# Patient Record
Sex: Male | Born: 1970 | Race: White | Hispanic: No | Marital: Single | Smoking: Never smoker
Health system: Southern US, Community
[De-identification: ages and names within clinical notes are randomized; demographics above are authoritative.]

## PROBLEM LIST (undated history)

## (undated) DIAGNOSIS — F419 Anxiety disorder, unspecified: Secondary | ICD-10-CM

## (undated) DIAGNOSIS — I2699 Other pulmonary embolism without acute cor pulmonale: Secondary | ICD-10-CM

## (undated) HISTORY — PX: ANKLE RECONSTRUCTION: SHX1151

## (undated) HISTORY — PX: BACK SURGERY: SHX140

---

## 2000-12-13 ENCOUNTER — Encounter: Payer: Self-pay | Admitting: Family Medicine

## 2000-12-13 ENCOUNTER — Encounter: Admission: RE | Admit: 2000-12-13 | Discharge: 2000-12-13 | Payer: Self-pay | Admitting: Family Medicine

## 2001-01-19 ENCOUNTER — Ambulatory Visit (HOSPITAL_COMMUNITY): Admission: RE | Admit: 2001-01-19 | Discharge: 2001-01-19 | Payer: Self-pay | Admitting: *Deleted

## 2001-01-19 ENCOUNTER — Encounter (INDEPENDENT_AMBULATORY_CARE_PROVIDER_SITE_OTHER): Payer: Self-pay | Admitting: Specialist

## 2001-01-21 ENCOUNTER — Encounter: Payer: Self-pay | Admitting: *Deleted

## 2001-01-21 ENCOUNTER — Ambulatory Visit (HOSPITAL_COMMUNITY): Admission: RE | Admit: 2001-01-21 | Discharge: 2001-01-21 | Payer: Self-pay | Admitting: *Deleted

## 2002-09-18 ENCOUNTER — Encounter: Payer: Self-pay | Admitting: Internal Medicine

## 2002-09-18 ENCOUNTER — Encounter: Admission: RE | Admit: 2002-09-18 | Discharge: 2002-09-18 | Payer: Self-pay | Admitting: Internal Medicine

## 2002-11-20 ENCOUNTER — Ambulatory Visit (HOSPITAL_COMMUNITY): Admission: RE | Admit: 2002-11-20 | Discharge: 2002-11-20 | Payer: Self-pay | Admitting: Gastroenterology

## 2004-01-12 ENCOUNTER — Ambulatory Visit (HOSPITAL_COMMUNITY): Admission: RE | Admit: 2004-01-12 | Discharge: 2004-01-12 | Payer: Self-pay | Admitting: Internal Medicine

## 2004-01-24 ENCOUNTER — Ambulatory Visit (HOSPITAL_COMMUNITY): Admission: RE | Admit: 2004-01-24 | Discharge: 2004-01-25 | Payer: Self-pay | Admitting: Neurosurgery

## 2004-01-26 ENCOUNTER — Inpatient Hospital Stay (HOSPITAL_COMMUNITY): Admission: EM | Admit: 2004-01-26 | Discharge: 2004-02-03 | Payer: Self-pay | Admitting: *Deleted

## 2004-02-04 ENCOUNTER — Encounter: Admission: RE | Admit: 2004-02-04 | Discharge: 2004-02-04 | Payer: Self-pay | Admitting: Family Medicine

## 2005-04-03 ENCOUNTER — Ambulatory Visit (HOSPITAL_COMMUNITY): Admission: RE | Admit: 2005-04-03 | Discharge: 2005-04-03 | Payer: Self-pay | Admitting: Neurosurgery

## 2009-11-15 ENCOUNTER — Emergency Department (HOSPITAL_COMMUNITY): Admission: EM | Admit: 2009-11-15 | Discharge: 2009-11-15 | Payer: Self-pay | Admitting: Emergency Medicine

## 2010-12-13 ENCOUNTER — Encounter: Payer: Self-pay | Admitting: Internal Medicine

## 2010-12-14 ENCOUNTER — Encounter: Payer: Self-pay | Admitting: Neurosurgery

## 2011-04-10 NOTE — Op Note (Signed)
NAME:  Gary Harper, TRAUM                        ACCOUNT NO.:  [id number]   MEDICAL RECORD NO.:  [id number]                   PATIENT TYPE:  OIB   LOCATION:  3014                                 FACILITY:  [facility]   PHYSICIAN:  Donalee Citrin, M.D.                     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   OPERATION PERFORMED:  Thoracic laminectomy with transpedicular decompression  and diskectomy at T8-9 on the right.   SURGEON:  Donalee Citrin, M.D.   ASSISTANT:  Reinaldo Meeker, M.D.   ANESTHESIA:  General endotracheal.   INDICATIONS FOR PROCEDURE:  The patient is a very pleasant 40 year old  gentleman who had two to three weeks ago the immediate onset of severe back  pain with radiating pain and numbness across a band across his chest  consistent with a T9 nerve root distribution.  The patient was refractory to  all forms of anti-inflammatory conservative treatment and steroids orally.  Preoperative imaging showed a very large ruptured disk with a large free  fragment compressing a thoracic spinal cord and displacing it to the left.  Due to the size and location of the fragment, its risks of developing  progressive myelopathy, the patient was recommended laminectomy and  diskectomy as well as transpedicular approach.  I extensively went over the  risks and benefits of surgery with him.  He understands and agrees to  proceed forward.   DESCRIPTION OF PROCEDURE:  The patient was brought to the operating room  where he was induced under general anesthesia.  He was positioned prone on  flat rolls.  His back was prepped and draped in the usual sterile fashion.  Due to the patient's preoperative imaging with both thoracic and lumbar  MRI  which showed a transitional level that was named S1 and five nonrib-bearing  vertebra counting the T8-9 disk space was achieved from below counting the  last rib-bearing vertebra as T12.  A midline incision was made after  localization film achieved and infiltration of 10 mL of lidocaine with  epinephrine, Bovie electrocautery was used to dissect subcutaneous tissue  and subperiosteal dissection was carried out to the lamina of what was  believed to be T8 and 9 but however, intraoperative x-ray confirmed to be  the  T9-10 disk space so this was extended one level up.  Then using a 2 and  3 mm Kerrison punch, inferior aspect of the lamina of T8 was removed.  Medial aspect facet complex and superior aspect of lamina of T9 exposing  ligamentum flavum.  This was removed piecemeal fashion exposing thecal sac.  Thecal sac was immediately noted to be  displaced medially and the disk was  immediately visualized extending dorsal contained within an adhesive  membrane dorsal to the dura.  So in order to remove this without significant  retraction on the thecal sac, the medial aspect facet complex was taken back  to pedicle.  Pedicle was partially bitten down medially and approach was  taken from the medial border of the pedicle down in the disk space and then  a large free fragment was removed.  The interspace was incised with an 11  blade scalpel.  There was already a rent appreciated slightly medial and  then from a lateral approach into the disk space using a micro pituitary the  disk spaces were adequately cleaned out.  There were a couple of other  fragments partially calcified superior aspect of end plate of T9.  These  were also bitten with osteophyte remover and a downgoing Epstein curet and  removed with pituitary rongeurs.  At the end of the diskectomy there was no  further compression on the thecal sac or the T9 nerve root which was  palpated out the foramen.  The thecal sac resumed  its normal and physiologic  location.  Wound was copiously irrigated.  Meticulous hemostasis was  maintained.  Muscle and fascia reapproximated in layers with Vicryl.  Subcutaneous tissue closed with 2-0 interrupted Vicryl.  Skin closed with  running 4-0 subcuticular, Benzoin and Steri-Strips applied.  The patient was  then transferred to the recovery room stable condition.                                               Donalee Citrin, M.D.    GC/MEDQ  D:  01/24/2004  T:  01/24/2004  Job:  [id number]

## 2011-04-10 NOTE — Procedures (Signed)
[facility]  Patient:    Gary Harper, Gary Harper                     MRN: [id number] Proc. Date: 01/19/01 Adm. Date:  [id number] Attending:  Sabino Gasser CC:         Gloriajean Dell. Andrey Campanile, M.D.  Heywood Footman, M.D.   Procedure Report  PROCEDURE:  Upper Endoscopy.  GASTROENTEROLOGIST:  Sabino Gasser, M.D.  INDICATIONS:  Gastroparesis, rule out gastric outlet obstruction.  ANESTHESIA:  Demerol 60 mg, Versed 6 mg.  PROCEDURE IN DETAIL:  The patient had noted that he had not eaten anything solid for nearly 24 hours prior to the procedure.   With the patient mildly sedated and in the left lateral decubitus position, the Olympus video endoscope was inserted in the mouth, passed under direct vision through the esophagus.  The distal esophagus was approached, and there was a question of Barretts esophagus photographed and subsequently biopsied.  We entered into the stomach, and there was a copious amount of bowel-colored clear fluid which we suctioned.  No food particles were noted at this time.  The endoscope was advanced through the stomach.  The antrum and specifically pyloric area were patent.  This was photographed.  We entered into the duodenal bulb which appeared normal, and the second portion duodenum, and there was a question of if the second portion of duodenum was somewhat contracted from below, and this area was photographed.  The mucosa itself appeared normal.  From this point, the endoscope was then slowly withdrawn, taking circumferential views of entire duodenal mucosa until the endoscope had been pulled back into the stomach, placed in retroflexion to view the stomach from below, and this appeared normal.  The endoscope was then straightened and withdrawn, taking circumferential views of entire gastric and esophageal mucosa which otherwise appeared normal.  The patients vital signs and pulse oximetry remained stable.  The patient tolerated the procedure well  with no apparent complications.  FINDINGS: 1. Question of Barretts esophagus, biopsied and photographed. 2. Copious amounts of clear bowel in the stomach without obstruction noted. 3. Question of impingement extrinsically on second portion of duodenum raises    concern about pancreatic process.  PLAN:  CT scan of abdomen; if negative, then gastric emptying study. DD:  01/19/01 TD:  01/19/01 Job: [id number]

## 2011-04-10 NOTE — H&P (Signed)
NAME:  Gary Harper, Gary Harper                        ACCOUNT NO.:  [id number]   MEDICAL RECORD NO.:  [id number]                   PATIENT TYPE:  INP   LOCATION:  3305                                 FACILITY:  [facility]   PHYSICIAN:  Penni Bombard, MD                    DATE OF BIRTH:  07-Sep-1971   DATE OF ADMISSION:  01/26/2004  DATE OF DISCHARGE:                                HISTORY & PHYSICAL   PRIMARY CARE PHYSICIAN:  Dr. Benedetto Goad.   CONSULTING PHYSICIAN:  Dr. Darrin Luis with neurosurgery.   REFERRING PHYSICIAN:  None.   CHIEF COMPLAINT:  Constipation/shortness of breath.   HISTORY OF PRESENT ILLNESS:  Gary Harper is a 40 year old gentleman who,  three days prior to admission, had recently undergone a thoracic  laminectomy.  He was doing well postop with a small hematoma at the incision  site but otherwise was doing fine when, on the day of admission, he strained  for approximately one hour in the bathroom to have a bowel movement and  noticed that he suddenly felt short of breath and dizzy.  He returned to the  bed and lay down.  Throughout the day he attempted several times to get up  and on every attempt felt short of breath and dizzy and was unable to have a  bowel movement throughout the day.  He communicated with Dr. Darrin Luis.  Dr. Darrin Luis  was concerned about his shortness of breath and the patient was greatly  concerned about his constipation.  They decided that he should proceed to  the emergency room.  Upon arrival to the emergency room, the patient again  attempted to have a bowel movement and, while straining, had dramatic  increase in shortness of breath.  The ED physician ordered a D-dimer at that  time which was 6.77 and a spiral CT of the chest which showed bilateral  moderate to large PEs and no DVTs in the lower extremities.  The patient was  thus admitted for anticoagulation.   PAST MEDICAL HISTORY:  Anxiety, sleep disturbance, thoracic laminectomy  three days prior to  admission, irritable bowel syndrome, history of anxiety-  related chest pain, and history of low back laminectomy in approximately  1998.   MEDICATIONS:  1. Paxil 25 mg p.o. daily.  2. Klonopin 1 mg p.o. as needed anxiety.   ALLERGIES:  Sulfa.   SOCIAL HISTORY:  Gary Harper lives in [place] with his wife.  He is in  Air[employer] in [employer].  He has no children.  He denies tobacco, denies  alcohol, and denies drugs.   FAMILY HISTORY:  No diabetes mellitus.  No strokes.  His uncle and  grandfather have both had MIs.  His aunt has breast cancer and he has an  uncle with prostate cancer.   REVIEW OF SYSTEMS:  No fevers, chills, night sweats, and no chest pain since  he has been on Paxil.  Positive abdominal pain that is intermittent.  No  diarrhea.  Positive constipation.  Positive episodes of bright red blood per  rectum which has been fully evaluated by GI and is thought to be secondary  to fissures or hemorrhoids.  No joint pains.  No weakness.   PHYSICAL EXAMINATION:  VITAL SIGNS:  Temp 98.6, pulse 79, respirations 18,  BP 129/70.  O2 98 on room air.  GENERAL:  He is a crying, very anxious, and very upset white male.  CARDIOVASCULAR:  Regular rate and rhythm.  No murmurs, rubs, or gallops.  LUNGS:  Clear to auscultation bilaterally with good air movement.  ABDOMEN:  Positive bowel sounds, nontender, nondistended, and soft.  Palpable stool in the left lower quadrant.  EXTREMITIES:  No cords.  Calves are equal in circumference.  No edema.  BACK:  Wound on back has multiple Steri-Strips in place with blood on the  strips.  There is a small area of a questionable hematoma underneath the  bandages.  MUSCULOSKELETAL:  5/5 bilaterally with 2+ DTRs.  HEENT:  TMs  are clear bilaterally.  Erythematous throat with no exudate.  PERRLA.  No lymphadenopathy.  No thyromegaly.  NEUROLOGIC:  Cranial nerves II-XII are grossly intact.  PSYCH:  Very anxious and upset.  GU:  Nontender prostate.   No nodules.  No impaction.   LABORATORIES:  D-dimer 6.77.  WBC 8.9, hemoglobin 12.9, hematocrit 38.1, and  platelets 292,000 with 66% neutrophils, 22% lymphs, 10% monos.  CT scan  shows bilateral moderate to large PEs.  Lower extremity CT scans are  negative for DVTs.  EKG shows an S in 1 and flipped Ts in III and AVF.  Hemoccult negative.   ASSESSMENT AND PLAN:  This is a 40 year old with bilateral pulmonary emboli  status post recent surgery.  1. Heme.  I spoke extensively with the pharmacy and Dr. Darrin Luis as we are very     concerned about this patient being so close to his surgery that he is at     risk for bleed at the surgery site with resultant cord compression.     Thus, we will start a heparin drip with a half size of the normal bolus     and we will titrate his heparin up slowly as we do not want to overshoot     this patient.  I will check neuro status q.2h. and follow his muscle     strength in his lower extremities.  I will monitor the site for bleeding     and instruct the patient to notify us if he notices any difficulty with     his bowel or bladder or has any tingling or strange sensation in his     lower extremities or notices any lower extremity weakness.  We will start     Coumadin once the patient is safely therapeutic on heparin.  We will     monitor the patient on telemetry.  He is not currently tachycardic or     tachypneic and not currently requiring any O2 SATs.  We will place the     patient on bed rest until he is therapeutic on his heparin.  2. GI.  The patient suffers from severe constipation.  We will give enemas     and start MiraLax and Colace.  We will start Protonix.  3. FEN.  We will start a regular diet and check a BMET in the morning.  4. Anxiety.  We will  continue his Paxil and give the patient Ativan as     needed.  I choose Ativan over Klonopin as I feel this is a better short-    acting agent given the fact that the patient has risk for  respiratory     compromise.  I do not want him on a long-acting agent.  5. Status post thoracic laminectomy by Dr. Darrin Luis.  We will hold on PT for now     given the patient is at high risk for bleed.  6. Social.  We will contact the patient's PCP in the morning as a courtesy     to let him know of his arrival in the hospital.  7. Pain.  We will give Percocet 5/325 one to two tabs p.o. q.4h. as needed     for pain and use morphine for breakthrough.                                                Penni Bombard, MD   SJ/MEDQ  D:  01/28/2004  T:  01/28/2004  Job:  [id number]

## 2011-04-10 NOTE — Discharge Summary (Signed)
NAME:  Gary Harper, Gary Harper                        ACCOUNT NO.:  [id number]   MEDICAL RECORD NO.:  [id number]                   PATIENT TYPE:  INP   LOCATION:  5702                                 FACILITY:  [facility]   PHYSICIAN:  Ike Bene, M.D.              DATE OF BIRTH:  [birth year]   DATE OF ADMISSION:  01/26/2004  DATE OF DISCHARGE:  02/03/2004                                 DISCHARGE SUMMARY   DISCHARGE DIAGNOSES:  1. Pulmonary embolism and infarction.  2. Constipation.   [name] was admitted to the hospital several days after having had  surgery on his back by neurosurgery.  He had a thoracic diskectomy that was  performed.  He was given appropriate DVT prophylaxis during his  hospitalization.  He, however, did have prolonged periods of sedentary state  for several weeks prior to hospitalization, and of course during  hospitalization.  He had no complications with regard to his local surgical  treatment.  There was no evidence of infection or other abnormality.   The patient was initially admitted by the family practice service for  reasons which are unclear; however, when they discovered the patient was my  patient, he was transferred over to our service.  The patient had  significant respiratory distress at the time of admission secondary to  pulmonary embolism.  He gradually improved within 24 hours.  He was given IV  therapy with heparin, and started on Coumadin.   He markedly improved within about 48 hours at the time of admission.  He had  no apparent complications, evidence of bleeding, or any other abnormalities  related to his surgical site.  The majority of his hospitalization was  waiting on his Coumadin treatment to become therapeutic with an adequate  INR.  He was maintained on heparin and Coumadin during that time.  He did  gradually improve each day.  He was able to ambulate without any significant  difficulty.  He does have an area of numbness and  anesthesia on the right  side of his trunk, but it did improve, as well.   DISCHARGE MEDICATIONS:  1. Coumadin 6 mg daily.  2. Paxil CR 25 daily.  3. Klonopin 1 mg as needed.  4. Tylenol two extra strength p.r.n.  (He was avoiding nonsteroidal anti-inflammatory drugs like ibuprofen,  aspirin, and Aleve.)   CONDITION ON DISCHARGE:  He was markedly improved at the time of discharge.   FOLLOW UP:  Follow up with Dr. Merril Abbe on February 06, 2004.                                                Ike Bene, M.D.    RM/MEDQ  D:  03/20/2004  T:  03/21/2004  Job:  [id number]

## 2011-04-10 NOTE — Op Note (Signed)
NAME:  Gary Harper, Gary Harper                        ACCOUNT NO.:  [id number]   MEDICAL RECORD NO.:  [id number]                   PATIENT TYPE:  AMB   LOCATION:  ENDO                                 FACILITY:  [facility]   PHYSICIAN:  Danise Edge, M.D.                DATE OF BIRTH:  [birth year]   DATE OF PROCEDURE:  11/20/2002  DATE OF DISCHARGE:                                 OPERATIVE REPORT   PROCEDURE:  Diagnostic colonoscopy.   INDICATIONS:  The patient is a 40 year old male born 2070/12/31.  The patient  has a two-year history of intermittent sharp with a burning quality  abdominal pain localized to the left lower quadrant.  The onset of pain is  not related to food intake.  When present, the pain gives him the urge to  have a bowel movement.  His bowel movements are not constipated or  diarrheal.  The pain intensity diminishes following bowel movements.  He  denies fever or gastrointestinal bleeding.  His episodes of pain also  respond to localized deep massage.  He reports no weight loss.   His pain will typically occur the first thing in the morning.  Once he has  had bowel movement, he lies in a fetal position and his pain resolves after  approximately 45 minutes.   The patient has undergone an esophagogastroduodenoscopy, upper GI small  bowel follow-through x-ray series, and CT scan of the abdomen and pelvis.  He tells me these studies were normal.   The patient has had prostatitis in the past.  He has had lumbar back surgery  in 2001 but denies back pain.   MEDICATION ALLERGIES:  SULFA.   MEDICATIONS:  Paxil and Klonopin for anxiety, depression, and insomnia.   PAST MEDICAL HISTORY:  Sinus infection, prostatitis, lumbar back surgery,  knee and ankle surgery, anxiety, depression, and insomnia.   FAMILY HISTORY:  His 70 year old father has hyperlipidemia.  His 20 year old  mother has rheumatoid arthritis.   HABITS:  The patient is a Immunologist at [employer].  He  rarely consumes alcohol.  He denies the use of tobacco products.  He is not  married and has no children.   ENDOSCOPIST:  Danise Edge, M.D.   PREMEDICATION:  Versed 10 mg, Demerol 100 mg.   ENDOSCOPE:  Olympus pediatric colonoscope.   DESCRIPTION OF PROCEDURE:  After obtaining informed consent, the patient was  placed in the left lateral decubitus position.  I administered intravenous  Demerol and intravenous Versed to achieve conscious sedation for the  procedure.  The patient's blood pressure, oxygen saturation, and cardiac  rhythm were monitored throughout the procedure and documented in the medical  record.   Anal inspection was normal.  Digital rectal exam revealed an enlarged but  non-nodular prostate.  The Olympus pediatric video colonoscope was  introduced into the rectum and easily advanced to the cecum.  A normally-  appearing  ileocecal valve was intubated and the distal ileum inspected.  Colonic preparation for the exam today was excellent.   Rectum normal.   Sigmoid colon and descending colon normal.   Splenic flexure normal.   Transverse colon normal.   Hepatic flexure normal.   Ascending colon normal.   Cecum and ileocecal valve normal.   Distal ileum normal.   ASSESSMENT:  Normal proctocolonoscopy to the cecum and distal ileoscopy.   RECOMMENDATIONS:  I suspect the patient has functional abdominal (large  bowel) pain and his predominant symptom of irritable bowel syndrome is  abdominal pain.  Will place him on an antispasmodic.                                                Danise Edge, M.D.    MJ/MEDQ  D:  11/20/2002  T:  11/20/2002  Job:  [id number]

## 2015-05-28 ENCOUNTER — Ambulatory Visit
Admission: RE | Admit: 2015-05-28 | Discharge: 2015-05-28 | Disposition: A | Discharge status: Home / Self Care | Payer: Self-pay | Source: Ambulatory Visit | Attending: Internal Medicine | Admitting: Internal Medicine

## 2015-05-28 ENCOUNTER — Other Ambulatory Visit: Payer: Self-pay | Admitting: Internal Medicine

## 2015-05-28 DIAGNOSIS — R059 Cough, unspecified: Secondary | ICD-10-CM

## 2015-05-28 DIAGNOSIS — R05 Cough: Secondary | ICD-10-CM

## 2015-07-17 ENCOUNTER — Other Ambulatory Visit: Payer: Self-pay | Admitting: Internal Medicine

## 2015-07-17 DIAGNOSIS — R1032 Left lower quadrant pain: Secondary | ICD-10-CM

## 2015-07-22 ENCOUNTER — Ambulatory Visit
Admission: RE | Admit: 2015-07-22 | Discharge: 2015-07-22 | Disposition: A | Discharge status: Home / Self Care | Payer: BLUE CROSS/BLUE SHIELD | Source: Ambulatory Visit | Attending: Internal Medicine | Admitting: Internal Medicine

## 2015-07-22 DIAGNOSIS — R1032 Left lower quadrant pain: Secondary | ICD-10-CM

## 2015-07-22 MED ORDER — IOPAMIDOL (ISOVUE-300) INJECTION 61%
125.0000 mL | Freq: Once | INTRAVENOUS | Status: AC | PRN
  Administered 2015-07-22: 125 mL via INTRAVENOUS

## 2016-04-01 ENCOUNTER — Other Ambulatory Visit: Payer: Self-pay | Admitting: Internal Medicine

## 2016-04-01 DIAGNOSIS — M79605 Pain in left leg: Secondary | ICD-10-CM

## 2016-04-01 DIAGNOSIS — R109 Unspecified abdominal pain: Secondary | ICD-10-CM

## 2016-04-05 ENCOUNTER — Ambulatory Visit
Admission: RE | Admit: 2016-04-05 | Discharge: 2016-04-05 | Disposition: A | Discharge status: Home / Self Care | Payer: BLUE CROSS/BLUE SHIELD | Source: Ambulatory Visit | Attending: Internal Medicine | Admitting: Internal Medicine

## 2016-04-05 DIAGNOSIS — M79605 Pain in left leg: Secondary | ICD-10-CM

## 2016-04-05 DIAGNOSIS — R109 Unspecified abdominal pain: Secondary | ICD-10-CM

## 2016-04-05 MED ORDER — GADOBENATE DIMEGLUMINE 529 MG/ML IV SOLN
20.0000 mL | Freq: Once | INTRAVENOUS | Status: AC | PRN
  Administered 2016-04-05: 20 mL via INTRAVENOUS

## 2017-07-05 ENCOUNTER — Emergency Department (HOSPITAL_COMMUNITY): Payer: BLUE CROSS/BLUE SHIELD

## 2017-07-05 ENCOUNTER — Encounter (HOSPITAL_COMMUNITY): Payer: Self-pay | Admitting: Emergency Medicine

## 2017-07-05 ENCOUNTER — Emergency Department (HOSPITAL_COMMUNITY)
Admission: EM | Admit: 2017-07-05 | Discharge: 2017-07-05 | Disposition: A | Discharge status: Home / Self Care | Payer: BLUE CROSS/BLUE SHIELD | Attending: Emergency Medicine | Admitting: Emergency Medicine

## 2017-07-05 DIAGNOSIS — R0789 Other chest pain: Secondary | ICD-10-CM | POA: Diagnosis not present

## 2017-07-05 DIAGNOSIS — R05 Cough: Secondary | ICD-10-CM | POA: Insufficient documentation

## 2017-07-05 DIAGNOSIS — R0602 Shortness of breath: Secondary | ICD-10-CM | POA: Insufficient documentation

## 2017-07-05 HISTORY — DX: Anxiety disorder, unspecified: F41.9

## 2017-07-05 HISTORY — DX: Other pulmonary embolism without acute cor pulmonale: I26.99

## 2017-07-05 LAB — CBC WITH DIFFERENTIAL/PLATELET
BASOS ABS: 0 10*3/uL (ref 0.0–0.1)
Basophils Relative: 0 %
EOS ABS: 0 10*3/uL (ref 0.0–0.7)
EOS PCT: 0 %
HCT: 39.6 % (ref 39.0–52.0)
Hemoglobin: 13.5 g/dL (ref 13.0–17.0)
Lymphocytes Relative: 23 %
Lymphs Abs: 1.9 10*3/uL (ref 0.7–4.0)
MCH: 30.9 pg (ref 26.0–34.0)
MCHC: 34.1 g/dL (ref 30.0–36.0)
MCV: 90.6 fL (ref 78.0–100.0)
MONO ABS: 0.7 10*3/uL (ref 0.1–1.0)
Monocytes Relative: 9 %
Neutro Abs: 5.4 10*3/uL (ref 1.7–7.7)
Neutrophils Relative %: 68 %
PLATELETS: 259 10*3/uL (ref 150–400)
RBC: 4.37 MIL/uL (ref 4.22–5.81)
RDW: 12.9 % (ref 11.5–15.5)
WBC: 8 10*3/uL (ref 4.0–10.5)

## 2017-07-05 LAB — BASIC METABOLIC PANEL
ANION GAP: 11 (ref 5–15)
BUN: 13 mg/dL (ref 6–20)
CALCIUM: 9.2 mg/dL (ref 8.9–10.3)
CO2: 22 mmol/L (ref 22–32)
Chloride: 104 mmol/L (ref 101–111)
Creatinine, Ser: 1.05 mg/dL (ref 0.61–1.24)
GFR calc Af Amer: >60 mL/min (ref >60)
GLUCOSE: 110 mg/dL — AB (ref 65–99)
Potassium: 3.7 mmol/L (ref 3.5–5.1)
SODIUM: 137 mmol/L (ref 135–145)

## 2017-07-05 LAB — I-STAT TROPONIN, ED: Troponin i, poc: 0 ng/mL (ref 0.00–0.08)

## 2017-07-05 LAB — TROPONIN I

## 2017-07-05 MED ORDER — IOPAMIDOL (ISOVUE-370) INJECTION 76%
INTRAVENOUS | Status: AC
  Filled 2017-07-05: qty 100

## 2017-07-05 MED ORDER — IOPAMIDOL (ISOVUE-370) INJECTION 76%
100.0000 mL | Freq: Once | INTRAVENOUS | Status: AC | PRN
  Administered 2017-07-05: 100 mL via INTRAVENOUS

## 2017-07-05 MED ORDER — SODIUM CHLORIDE 0.9 % IV SOLN
INTRAVENOUS | Status: DC
  Administered 2017-07-05: 20:00:00 via INTRAVENOUS

## 2017-07-05 MED ORDER — SODIUM CHLORIDE 0.9 % IJ SOLN
INTRAMUSCULAR | Status: AC
  Filled 2017-07-05: qty 50

## 2017-07-05 MED ORDER — ACETAMINOPHEN 500 MG PO TABS
1000.0000 mg | ORAL_TABLET | Freq: Once | ORAL | Status: AC
  Administered 2017-07-05: 1000 mg via ORAL
  Filled 2017-07-05: qty 2

## 2017-07-05 MED ORDER — KETOROLAC TROMETHAMINE 30 MG/ML IJ SOLN
15.0000 mg | Freq: Once | INTRAMUSCULAR | Status: AC
  Administered 2017-07-05: 15 mg via INTRAVENOUS
  Filled 2017-07-05: qty 1

## 2017-07-05 MED ORDER — METHOCARBAMOL 750 MG PO TABS: 750.0000 mg | ORAL_TABLET | Freq: Four times a day (QID) | ORAL | 0 refills | Status: DC

## 2017-07-05 MED ORDER — LORAZEPAM 2 MG/ML IJ SOLN
0.5000 mg | Freq: Once | INTRAMUSCULAR | Status: AC
  Administered 2017-07-05: 0.5 mg via INTRAVENOUS
  Filled 2017-07-05: qty 1

## 2017-07-05 NOTE — ED Triage Notes (Signed)
Pt was sent in by PCP after having a cough with chest pain. States that he has a hx of PE and was sent for CT angio to r/o such. Alert and oriented. 99% RA.

## 2017-07-05 NOTE — ED Notes (Signed)
Patient transported to CT 

## 2017-07-05 NOTE — ED Provider Notes (Signed)
[facility]-EMERGENCY DEPT Provider Note   CSN: [id number] Arrival date & time: 07/05/17  1712     History   Chief Complaint Chief Complaint  Patient presents with  . Shortness of Breath    HPI Gary Harper is a 46 y.o. male.  46 year old malepresents with 2 day history of chest discomfort characterized as left-sided pleuritic pain. History of PE in the past associated after surgery. Does not currently take any blood thinners. Denies any leg pain or swelling. Does take medications chronically for anxiety. Has had a nonproductive cough without fever or chills. Denies any anginal symptoms. Saw his doctor today and was sent to for further evaluation.      Past Medical History:  Diagnosis Date  . Anxiety   . PE (pulmonary thromboembolism) (HCC)     There are no active problems to display for this patient.   Past Surgical History:  Procedure Laterality Date  . ANKLE RECONSTRUCTION    . BACK SURGERY         Home Medications    Prior to Admission medications   Not on File    Family History No family history on file.  Social History Social History  Substance Use Topics  . Smoking status: Never Smoker  . Smokeless tobacco: Not on file  . Alcohol use Yes     Allergies   Sulfa antibiotics   Review of Systems Review of Systems  All other systems reviewed and are negative.    Physical Exam Updated Vital Signs BP (!) 151/98 (BP Location: Left Arm)   Pulse 70   Temp 98.8 F (37.1 C) (Oral)   Resp 18   SpO2 99%   Physical Exam  Constitutional: He is oriented to person, place, and time. He appears well-developed and well-nourished.  Non-toxic appearance. No distress.  HENT:  Head: Normocephalic and atraumatic.  Eyes: Pupils are equal, round, and reactive to light. Conjunctivae, EOM and lids are normal.  Neck: Normal range of motion. Neck supple. No tracheal deviation present. No thyroid mass present.  Cardiovascular: Normal rate, regular rhythm and  normal heart sounds.  Exam reveals no gallop.   No murmur heard. Pulmonary/Chest: Effort normal and breath sounds normal. No stridor. No respiratory distress. He has no decreased breath sounds. He has no wheezes. He has no rhonchi. He has no rales.  Abdominal: Soft. Normal appearance and bowel sounds are normal. He exhibits no distension. There is no tenderness. There is no rebound and no CVA tenderness.  Musculoskeletal: Normal range of motion. He exhibits no edema or tenderness.  Neurological: He is alert and oriented to person, place, and time. He has normal strength. No cranial nerve deficit or sensory deficit. GCS eye subscore is 4. GCS verbal subscore is 5. GCS motor subscore is 6.  Skin: Skin is warm and dry. No abrasion and no rash noted.  Psychiatric: He has a normal mood and affect. His speech is normal and behavior is normal.  Nursing note and vitals reviewed.    ED Treatments / Results  Labs (all labs ordered are listed, but only abnormal results are displayed) Labs Reviewed  CBC WITH DIFFERENTIAL/PLATELET  BASIC METABOLIC PANEL  TROPONIN I    EKG  EKG Interpretation  Date/Time:  Monday July 05 2017 17:44:41 EDT Ventricular Rate:  78 PR Interval:    QRS Duration: 91 QT Interval:  378 QTC Calculation: 431 R Axis:   73 Text Interpretation:  Sinus rhythm Probable left atrial enlargement RSR' in V1 or V2,  probably normal variant ST elev, probable normal early repol pattern Baseline wander in lead(s) V2 No significant change since last tracing Confirmed by Lorre Nick ([id number]) on 07/05/2017 5:54:34 PM       Radiology No results found.  Procedures Procedures (including critical care time)  Medications Ordered in ED Medications  0.9 %  sodium chloride infusion (not administered)     Initial Impression / Assessment and Plan / ED Course  I have reviewed the triage vital signs and the nursing notes.  Pertinent labs & imaging results that were available during  my care of the patient were reviewed by me and considered in my medical decision making (see chart for details).     8:51 PM Patient reassessed and chest CT negative for PE. EKG without acute ischemic changes. Troponin negative.  9:43 PM Patient reassessed after being given pain medicine feels better. Do not think this represents ACS. Patient's heart score is 1. Return precautions given  Final Clinical Impressions(s) / ED Diagnoses   Final diagnoses:  None    New Prescriptions New Prescriptions   No medications on file     Lorre Nick, MD 07/05/17 2143

## 2017-07-05 NOTE — ED Notes (Signed)
Pt sent by PCP Valentina Lucks([place]) to r/o PE.  C/o chest pain and SOB.  Hx of PE.

## 2017-12-18 IMAGING — CT CT ANGIO CHEST
2 of 6 series · 18 of 36 positions shown · IV contrast (ISOVUE 370)
Comparison: Chest radiograph 05/28/2015

CLINICAL DATA: Cough with chest pain.  History of the.

EXAM:
CT ANGIOGRAPHY CHEST WITH CONTRAST
TECHNIQUE: Multidetector CT imaging of the chest was performed using the
standard protocol during bolus administration of intravenous
contrast. Multiplanar CT image reconstructions and MIPs were
obtained to evaluate the vascular anatomy.
CONTRAST:  100 mL Isovue 370

[Series 7: thins for pacs · axial · 0.82mm/px · z∈[-320,-18]mm · 17 of 336 slices shown]
[im 17/336  lung]
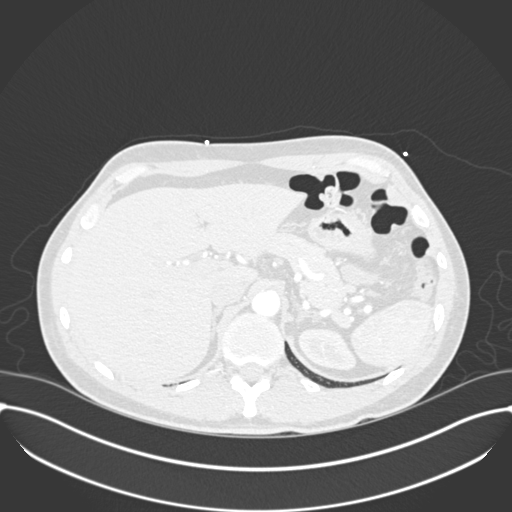
[im 34/336  mediastinal]
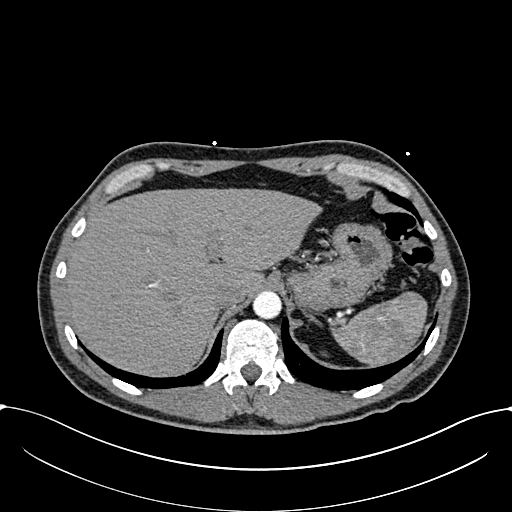
[im 51/336  lung]
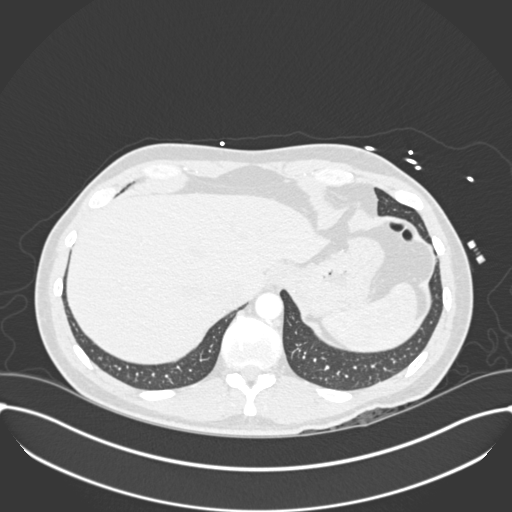
[im 68/336  mediastinal]
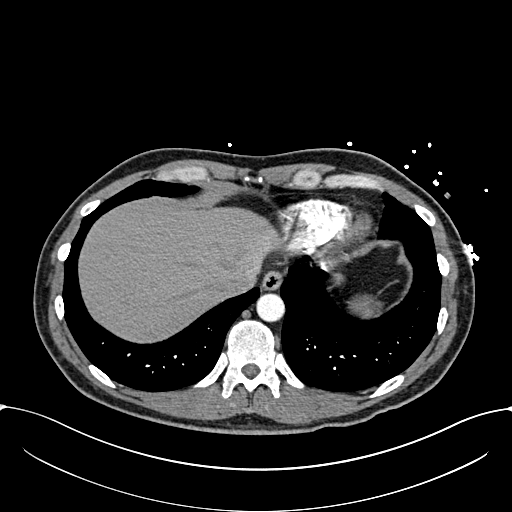
[im 101/336  lung]
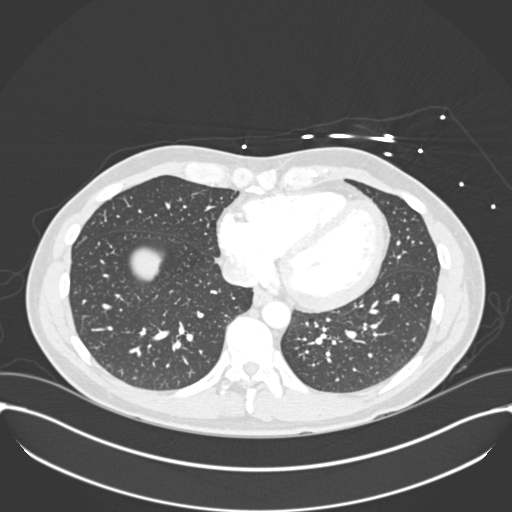
[im 118/336  mediastinal]
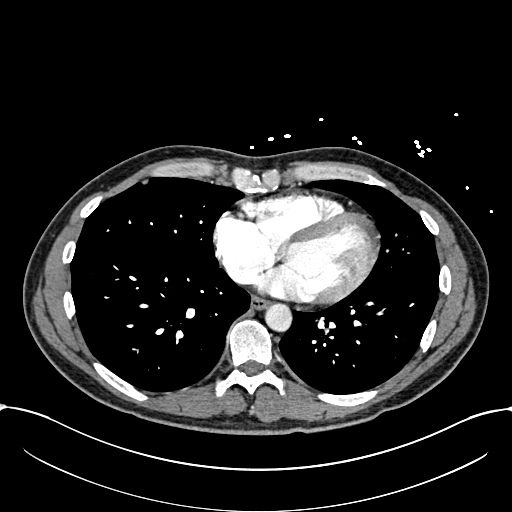
[im 135/336  lung]
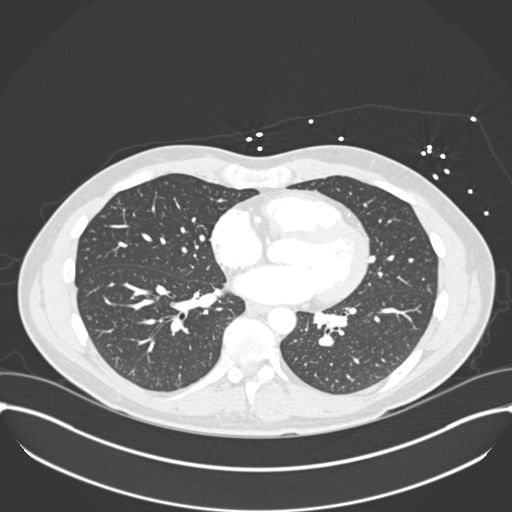
[im 151/336  mediastinal]
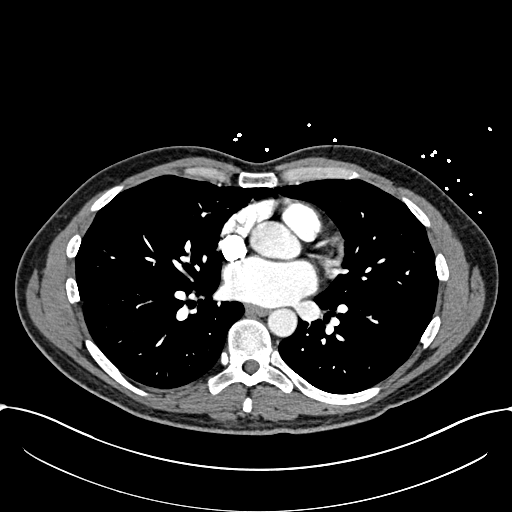
[im 168/336  lung]
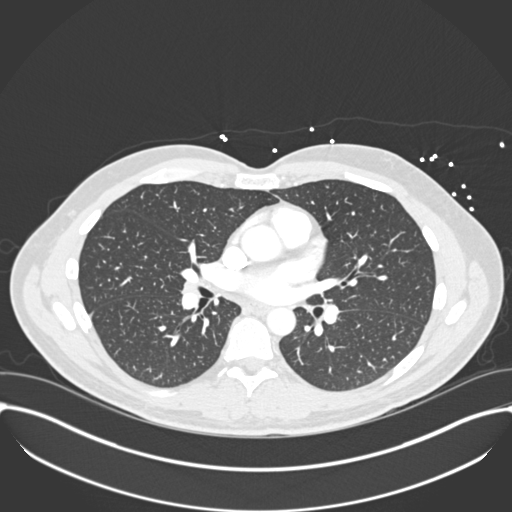
[im 185/336  mediastinal]
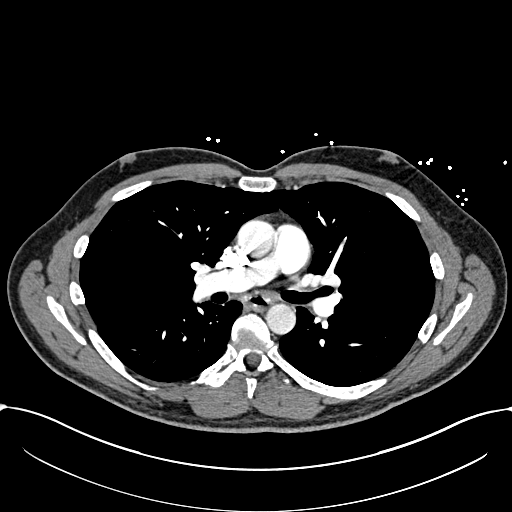
[im 202/336  lung]
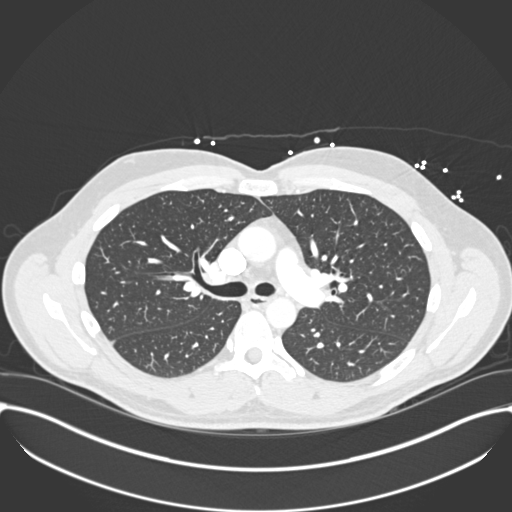
[im 218/336  mediastinal]
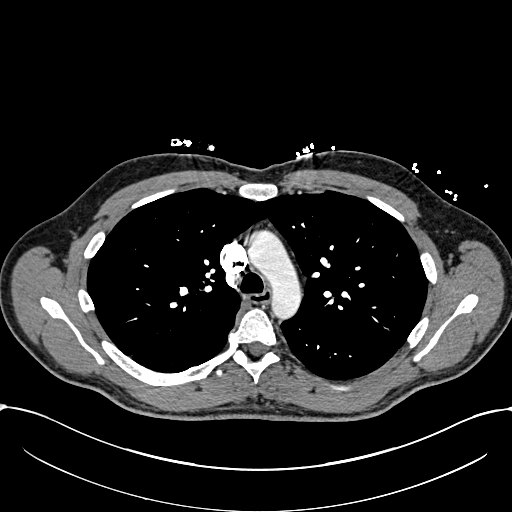
[im 235/336  lung]
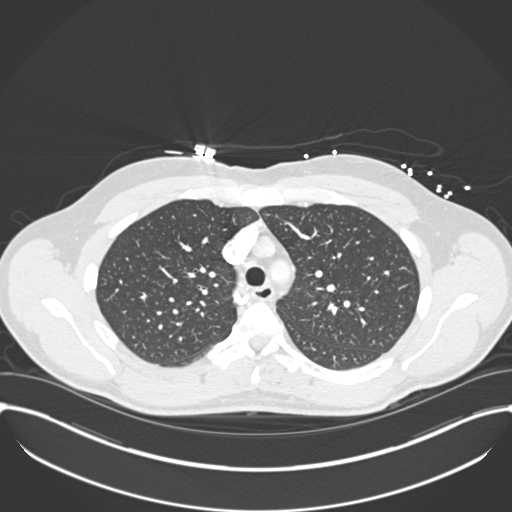
[im 269/336  mediastinal]
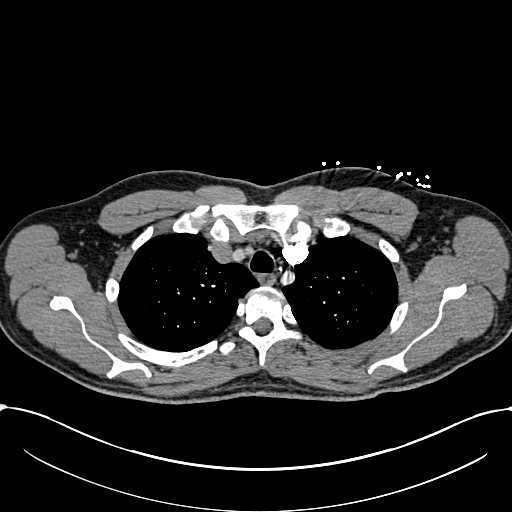
[im 285/336  lung]
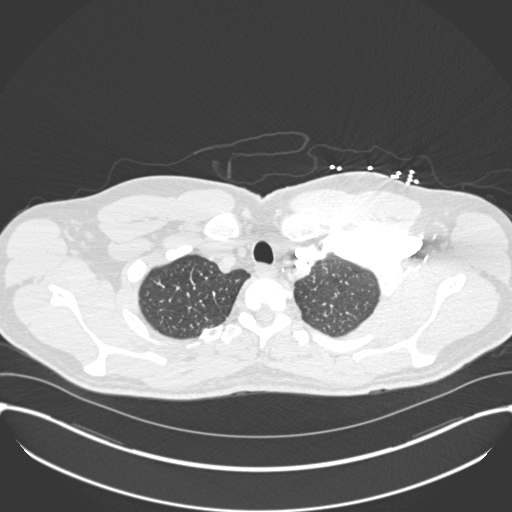
[im 302/336  mediastinal]
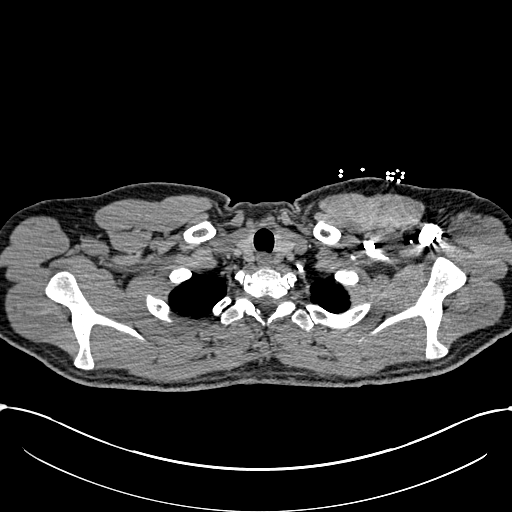
[im 319/336  lung]
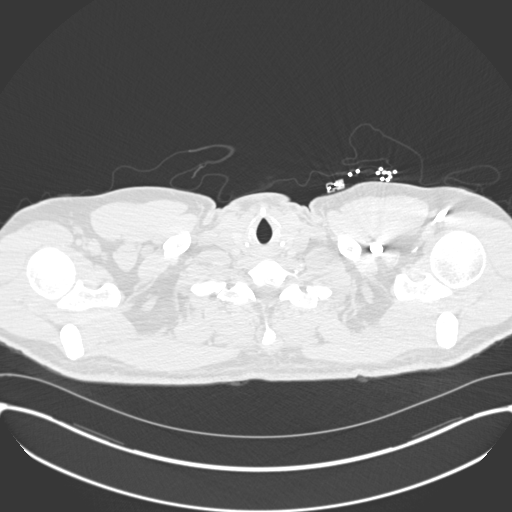

[Series 9: coronal mpr · coronal · 0.65mm/px · 1 of 147 slices shown]
[im 74/147  mediastinal]
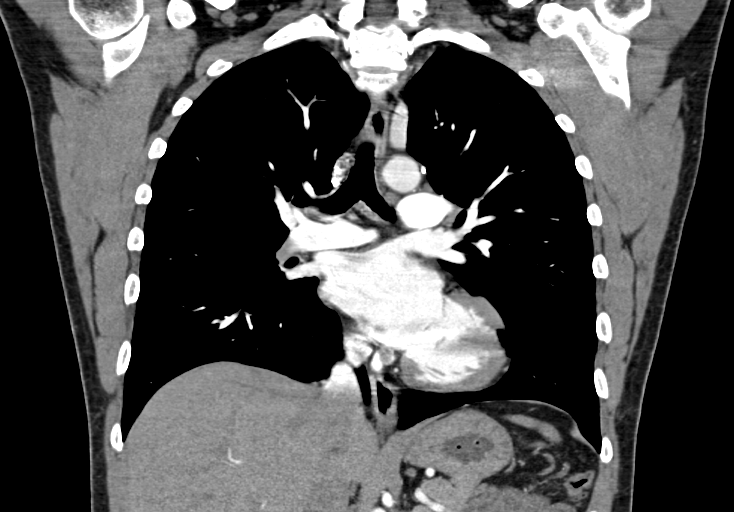

[18 of 36 positions shown; findings below may reference images not displayed]

FINDINGS: Cardiovascular: Contrast injection is sufficient to demonstrate
satisfactory opacification of the pulmonary arteries to the
segmental level. There is no pulmonary embolus. The main pulmonary
artery is within normal limits for size. There is no CT evidence of
acute right heart strain. The visualized aorta is normal. There is a
normal variant aortic arch branching pattern with the
brachiocephalic and left common carotid arteries sharing a common
origin.. Heart size is normal, without pericardial effusion.

Mediastinum/Nodes: No mediastinal, hilar or axillary
lymphadenopathy. The visualized thyroid and thoracic esophageal
course are unremarkable.

Lungs/Pleura: No pulmonary nodules or masses. No pleural effusion or
pneumothorax. No focal airspace consolidation. No focal pleural
abnormality.

Upper Abdomen: Contrast bolus timing is not optimized for evaluation
of the abdominal organs. Within this limitation, the visualized
organs of the upper abdomen are normal.

Musculoskeletal: No chest wall abnormality. No acute or significant
osseous findings.

Review of the MIP images confirms the above findings.
IMPRESSION: No pulmonary embolus or other acute thoracic abnormality.

## 2018-05-01 ENCOUNTER — Other Ambulatory Visit: Payer: Self-pay

## 2018-05-01 ENCOUNTER — Encounter (HOSPITAL_COMMUNITY): Payer: Self-pay

## 2018-05-01 ENCOUNTER — Emergency Department (HOSPITAL_COMMUNITY)
Admission: EM | Admit: 2018-05-01 | Discharge: 2018-05-02 | Disposition: A | Discharge status: Other Acute Inpatient Hospital | Payer: BLUE CROSS/BLUE SHIELD | Attending: Emergency Medicine | Admitting: Emergency Medicine

## 2018-05-01 DIAGNOSIS — R45851 Suicidal ideations: Secondary | ICD-10-CM | POA: Insufficient documentation

## 2018-05-01 DIAGNOSIS — Z79899 Other long term (current) drug therapy: Secondary | ICD-10-CM | POA: Insufficient documentation

## 2018-05-01 DIAGNOSIS — R531 Weakness: Secondary | ICD-10-CM | POA: Insufficient documentation

## 2018-05-01 DIAGNOSIS — T50902A Poisoning by unspecified drugs, medicaments and biological substances, intentional self-harm, initial encounter: Secondary | ICD-10-CM | POA: Diagnosis present

## 2018-05-01 DIAGNOSIS — F419 Anxiety disorder, unspecified: Secondary | ICD-10-CM | POA: Insufficient documentation

## 2018-05-01 DIAGNOSIS — Z86711 Personal history of pulmonary embolism: Secondary | ICD-10-CM | POA: Insufficient documentation

## 2018-05-01 DIAGNOSIS — F329 Major depressive disorder, single episode, unspecified: Secondary | ICD-10-CM | POA: Diagnosis not present

## 2018-05-01 DIAGNOSIS — R450 Nervousness: Secondary | ICD-10-CM | POA: Diagnosis not present

## 2018-05-01 DIAGNOSIS — R4587 Impulsiveness: Secondary | ICD-10-CM | POA: Diagnosis not present

## 2018-05-01 DIAGNOSIS — Z008 Encounter for other general examination: Secondary | ICD-10-CM | POA: Diagnosis present

## 2018-05-01 LAB — CBC WITH DIFFERENTIAL/PLATELET
BASOS PCT: 0 %
Basophils Absolute: 0 10*3/uL (ref 0.0–0.1)
EOS ABS: 0 10*3/uL (ref 0.0–0.7)
EOS PCT: 0 %
HEMATOCRIT: 40.4 % (ref 39.0–52.0)
Hemoglobin: 13.7 g/dL (ref 13.0–17.0)
Lymphocytes Relative: 16 %
Lymphs Abs: 1 10*3/uL (ref 0.7–4.0)
MCH: 31.1 pg (ref 26.0–34.0)
MCHC: 33.9 g/dL (ref 30.0–36.0)
MCV: 91.8 fL (ref 78.0–100.0)
MONO ABS: 0.4 10*3/uL (ref 0.1–1.0)
Monocytes Relative: 6 %
Neutro Abs: 4.5 10*3/uL (ref 1.7–7.7)
Neutrophils Relative %: 78 %
PLATELETS: 232 10*3/uL (ref 150–400)
RBC: 4.4 MIL/uL (ref 4.22–5.81)
RDW: 13 % (ref 11.5–15.5)
WBC: 5.9 10*3/uL (ref 4.0–10.5)

## 2018-05-01 LAB — ETHANOL

## 2018-05-01 LAB — COMPREHENSIVE METABOLIC PANEL
ALK PHOS: 42 U/L (ref 38–126)
ALT: 25 U/L (ref 17–63)
ANION GAP: 10 (ref 5–15)
AST: 30 U/L (ref 15–41)
Albumin: 4.3 g/dL (ref 3.5–5.0)
BUN: 13 mg/dL (ref 6–20)
CALCIUM: 8.9 mg/dL (ref 8.9–10.3)
CO2: 24 mmol/L (ref 22–32)
Chloride: 106 mmol/L (ref 101–111)
Creatinine, Ser: 1 mg/dL (ref 0.61–1.24)
Glucose, Bld: 113 mg/dL — ABNORMAL HIGH (ref 65–99)
Potassium: 4.2 mmol/L (ref 3.5–5.1)
Sodium: 140 mmol/L (ref 135–145)
TOTAL PROTEIN: 7.2 g/dL (ref 6.5–8.1)
Total Bilirubin: 0.4 mg/dL (ref 0.3–1.2)

## 2018-05-01 LAB — ACETAMINOPHEN LEVEL

## 2018-05-01 LAB — SALICYLATE LEVEL: Salicylate Lvl: <7 mg/dL (ref 2.8–30.0)

## 2018-05-01 LAB — CBG MONITORING, ED: GLUCOSE-CAPILLARY: 113 mg/dL — AB (ref 65–99)

## 2018-05-01 MED ORDER — LORAZEPAM 1 MG PO TABS: 0.0000 mg | ORAL_TABLET | Freq: Two times a day (BID) | ORAL | Status: DC

## 2018-05-01 MED ORDER — LORAZEPAM 1 MG PO TABS
0.0000 mg | ORAL_TABLET | Freq: Four times a day (QID) | ORAL | Status: DC
  Administered 2018-05-01: 1 mg via ORAL
  Filled 2018-05-01: qty 1

## 2018-05-01 MED ORDER — THIAMINE HCL 100 MG/ML IJ SOLN: 100.0000 mg | Freq: Every day | INTRAMUSCULAR | Status: DC

## 2018-05-01 MED ORDER — LORAZEPAM 2 MG/ML IJ SOLN: 0.0000 mg | Freq: Two times a day (BID) | INTRAMUSCULAR | Status: DC

## 2018-05-01 MED ORDER — LORAZEPAM 2 MG/ML IJ SOLN: 0.0000 mg | Freq: Four times a day (QID) | INTRAMUSCULAR | Status: DC

## 2018-05-01 MED ORDER — VITAMIN B-1 100 MG PO TABS
100.0000 mg | ORAL_TABLET | Freq: Every day | ORAL | Status: DC
  Administered 2018-05-01 – 2018-05-02 (×2): 100 mg via ORAL
  Filled 2018-05-01 (×2): qty 1

## 2018-05-01 NOTE — ED Notes (Signed)
Sitter at bedside.

## 2018-05-01 NOTE — ED Notes (Signed)
Pt has been wanded by security and personal belongings have been bagged and put in cabinets by 23-25.

## 2018-05-01 NOTE — ED Notes (Signed)
Gave pt urinal.  Pt stated that he could not urinate.

## 2018-05-01 NOTE — ED Notes (Signed)
Pt refused vitals 

## 2018-05-01 NOTE — ED Triage Notes (Signed)
Pt comes home. Pt is arrived via [facility]. Pt had taken around 20 trazodone 100mg  tablets with a few beers last night. Wife found him hard to wake up and pt was complaining of dizziness. Pt is AOx4 and Ambulatory. Pt denies SI and HI as of now.

## 2018-05-01 NOTE — ED Provider Notes (Signed)
[facility]-EMERGENCY DEPT Provider Note   CSN: [id number] Arrival date & time: 05/01/18  1344     History   Chief Complaint Chief Complaint  Patient presents with  . Suicidal  . Drug Overdose    HPI Gary Harper is a 47 y.o. male.  47 year old male presents after reported overdose.  Patient reports that he is going through a separation with his wife and has been depressed.  He reports that last night he felt more depressed and attempted overdose.  He reports that after drinking moderate amount of alcohol he took a total of 20 tablets of trazodone.  Each tablet was 100 mg.  This morning he was found by his wife at home and sent to the ED for evaluation.  Upon arrival he is alert and oriented.  He reports that he feels "weak" following the OD last night.  He denies any chest pain, nausea, vomiting, fever, or other acute complaint now.  He endorses mild suicidal ideation at this time. He denies prior suicide attempts prior to last night.   The history is provided by the patient.  Mental Health Problem  Presenting symptoms: suicidal thoughts, suicidal threats and suicide attempt   Degree of incapacity (severity):  Moderate Onset quality:  Sudden Timing:  Constant Progression:  Unchanged Chronicity:  New Context: alcohol use   Relieved by:  Nothing Worsened by:  Nothing   Past Medical History:  Diagnosis Date  . Anxiety   . PE (pulmonary thromboembolism) (HCC)     There are no active problems to display for this patient.   Past Surgical History:  Procedure Laterality Date  . ANKLE RECONSTRUCTION    . BACK SURGERY          Home Medications    Prior to Admission medications   Medication Sig Start Date End Date Taking? Authorizing Provider  busPIRone (BUSPAR) 15 MG tablet Take 15 mg by mouth 2 (two) times daily. 06/23/17   [provider]  LORazepam (ATIVAN) 1 MG tablet Take 1 mg by mouth 3 (three) times daily as needed. 06/28/17    [provider]  methocarbamol (ROBAXIN-750) 750 MG tablet Take 1 tablet (750 mg total) by mouth 4 (four) times daily. 07/05/17   Lorre Nick, MD  rOPINIRole (REQUIP) 1 MG tablet Take 3 mg by mouth at bedtime. 05/28/17   [provider]  sertraline (ZOLOFT) 100 MG tablet Take 200 mg by mouth daily. 04/12/17   [provider]  traZODone (DESYREL) 50 MG tablet Take 50-100 mg by mouth at bedtime as needed for sleep.  06/07/17   [provider]    Family History No family history on file.  Social History Social History   Tobacco Use  . Smoking status: Never Smoker  . Smokeless tobacco: Never Used  Substance Use Topics  . Alcohol use: Yes  . Drug use: Never     Allergies   Sulfa antibiotics   Review of Systems Review of Systems  Psychiatric/Behavioral: Positive for suicidal ideas.       Reports overdose   All other systems reviewed and are negative.    Physical Exam Updated Vital Signs Temp 98.6 F (37 C) (Oral)   Ht 6\' 1"  (1.854 m)   Wt 95.3 kg (210 lb)   BMI 27.71 kg/m   Physical Exam  Constitutional: He is oriented to person, place, and time. He appears well-developed and well-nourished. No distress.  HENT:  Head: Normocephalic and atraumatic.  Mouth/Throat: Oropharynx  is clear and moist.  Eyes: Pupils are equal, round, and reactive to light. Conjunctivae and EOM are normal.  Neck: Normal range of motion. Neck supple.  Cardiovascular: Normal rate, regular rhythm and normal heart sounds.  Pulmonary/Chest: Effort normal and breath sounds normal. No respiratory distress.  Abdominal: Soft. He exhibits no distension. There is no tenderness.  Musculoskeletal: Normal range of motion. He exhibits no edema or deformity.  Neurological: He is alert and oriented to person, place, and time. No cranial nerve deficit or sensory deficit. He exhibits normal muscle tone. Coordination normal.  AOx4 Alert Conversant Normal speech No facial  droop No focal weakness   Skin: Skin is warm and dry.  Psychiatric: He has a normal mood and affect.  Endorses SI with overdose attempt last night   Nursing note and vitals reviewed.    ED Treatments / Results  Labs (all labs ordered are listed, but only abnormal results are displayed) Labs Reviewed  COMPREHENSIVE METABOLIC PANEL - Abnormal; Notable for the following components:      Result Value   Glucose, Bld 113 (*)    All other components within normal limits  ACETAMINOPHEN LEVEL - Abnormal; Notable for the following components:   Acetaminophen (Tylenol), Serum <10 (*)    All other components within normal limits  CBG MONITORING, ED - Abnormal; Notable for the following components:   Glucose-Capillary 113 (*)    All other components within normal limits  ETHANOL  SALICYLATE LEVEL  CBC WITH DIFFERENTIAL/PLATELET  URINALYSIS, ROUTINE W REFLEX MICROSCOPIC  RAPID URINE DRUG SCREEN, HOSP PERFORMED    EKG EKG Interpretation  Date/Time:  Sunday May 01 2018 14:04:09 EDT Ventricular Rate:  66 PR Interval:    QRS Duration: 100 QT Interval:  466 QTC Calculation: 489 R Axis:   46 Text Interpretation:  Sinus rhythm RSR' in V1 or V2, probably normal variant Borderline prolonged QT interval Confirmed by Kristine RoyalMessick, Peter [phone] on 05/01/2018 2:24:33 PM   Radiology No results found.  Procedures Procedures (including critical care time)  Medications Ordered in ED Medications - No data to display   Initial Impression / Assessment and Plan / ED Course  I have reviewed the triage vital signs and the nursing notes.  Pertinent labs & imaging results that were available during my care of the patient were reviewed by me and considered in my medical decision making (see chart for details).     MDM  Screen complete  Patient is presenting for evaluation following a reported overdose attempt.  Patient reportedly took a large amount of trazodone last night.  He reports that this  was a suicide attempt.  Patient requires further psychiatric evaluation and possible inpatient treatment.    IVC obtained.   He is medically clear at this time for further psychiatric evaluation and treatment.  Final Clinical Impressions(s) / ED Diagnoses   Final diagnoses:  Suicidal ideation  Intentional drug overdose, initial encounter [facility](HCC)    ED Discharge Orders    None       Wynetta FinesMessick, Peter C, MD 05/01/18 [phone]

## 2018-05-01 NOTE — ED Notes (Signed)
Psych Provider at bedside with patient.  Report given to SAPPU.

## 2018-05-01 NOTE — ED Notes (Signed)
Bed: [id number] Expected date:  Expected time:  Means of arrival:  Comments: Trazodone OD last pm

## 2018-05-01 NOTE — BH Assessment (Signed)
Assessment Note  Gary Harper is a 47 y.o. male who presented to [facility] on a voluntary basis after intentionally ingeesting 20 100 mg tabs of Trazodone.  Pt is now under IVC (petition completed by EDP).  Pt has not been assessed by TTS previously.  Pt reported that he became despondent yesterday after his wife left him.  Pt reported that he decided to kill himself and so overdosed on Trazodone.  The Trazodone is prescribed by Dr. Jennelle Human, whom Pt sees for treatment of anxiety.  Per report, Pt's wife found him and requested transport to the hospital.  Pt denied any past suicide attempts, and he denied past depressive symptoms.  Pt denied homicidal ideation, self-injurious behavior, hallucination, and substance use.  UDS and BAC were not available at time of assessment.  Pt denied other depressive symptoms.    Pt was alert and oriented.  He had good eye contact and was cooperative in session.  Pt's mood was sad, and affect was mood-congruent.  Pt endorsed suicidal ideation with plan and intent -- overdose.  Pt's speech was normal in rate, rhythm, and volume.  Pt's thought processes were within normal range, and thought content was logical and goal-oriented.  There was no evidence of delusion.  Pt's memory and concentration were intact.  Insight, judgment, and impulse control were deemed poor.  Consulted with Irving Burton, NP, who determined that final disposition should not be made without UDS and BAC.  Recommended Pt remain in the ED, be observed, reassessed in AM.  Diagnosis:   Past Medical History:  Past Medical History:  Diagnosis Date  . Anxiety   . PE (pulmonary thromboembolism) (HCC)     Past Surgical History:  Procedure Laterality Date  . ANKLE RECONSTRUCTION    . BACK SURGERY      Family History: No family history on file.  Social History:  reports that he has never smoked. He has never used smokeless tobacco. He reports that he drinks alcohol. He reports that he does not use  drugs.  Additional Social History:  Alcohol / Drug Use Pain Medications: See MAR Prescriptions: See MAR Over the Counter: See MAR History of alcohol / drug use?: Yes Substance #1 Name of Substance 1: Alcohol 1 - Amount (size/oz): Varied 1 - Frequency: Weekly 1 - Duration: Ongoing 1 - Last Use / Amount: BAC not available at time of assessment  CIWA: CIWA-Ar BP: 135/86 Pulse Rate: 74 COWS:    Allergies:  Allergies  Allergen Reactions  . Sulfa Antibiotics Rash    Home Medications:  (Not in a hospital admission)  OB/GYN Status:  No LMP for male patient.  General Assessment Data TTS Assessment: In system Is this a Tele or Face-to-Face Assessment?: Face-to-Face Is this an Initial Assessment or a Re-assessment for this encounter?: Initial Assessment Marital status: Separated Is patient pregnant?: No Pregnancy Status: No Living Arrangements: Alone Can pt return to current living arrangement?: Yes Admission Status: Involuntary(EDP petitioned for IVC) Is patient capable of signing voluntary admission?: Yes Referral Source: Self/Family/Friend Insurance type: BCBS     Crisis Care Plan Living Arrangements: Alone Name of Psychiatrist: Dr. Jennelle Human     Risk to self with the past 6 months Suicidal Ideation: Yes-Currently Present Has patient been a risk to self within the past 6 months prior to admission? : No Suicidal Intent: Yes-Currently Present Has patient had any suicidal intent within the past 6 months prior to admission? : No Is patient at risk for suicide?: Yes Suicidal Plan?:  Yes-Currently Present Has patient had any suicidal plan within the past 6 months prior to admission? : No Specify Current Suicidal Plan: Pt overdosed on Trazodone Access to Means: Yes Specify Access to Suicidal Means: Prescribed Trazodone What has been your use of drugs/alcohol within the last 12 months?: Beer Previous Attempts/Gestures: No Intentional Self Injurious Behavior: None Recent  stressful life event(s): Other (Comment)(Separation from wife) Persecutory voices/beliefs?: No Depression: Yes Depression Symptoms: Despondent Substance abuse history and/or treatment for substance abuse?: No Suicide prevention information given to non-admitted patients: Not applicable  Risk to Others within the past 6 months Homicidal Ideation: No Does patient have any lifetime risk of violence toward others beyond the six months prior to admission? : No Thoughts of Harm to Others: No Current Homicidal Intent: No Current Homicidal Plan: No Access to Homicidal Means: No History of harm to others?: No Assessment of Violence: None Noted Does patient have access to weapons?: No Criminal Charges Pending?: No Does patient have a court date: No Is patient on probation?: No  Psychosis Hallucinations: None noted Delusions: None noted  Mental Status Report Appearance/Hygiene: In scrubs, Unremarkable Eye Contact: Good Motor Activity: Freedom of movement Speech: Unremarkable Level of Consciousness: Alert Mood: Sad Affect: Appropriate to circumstance Anxiety Level: None Thought Processes: Coherent, Relevant Judgement: Impaired Orientation: Person, Place, Time, Situation Obsessive Compulsive Thoughts/Behaviors: None  Cognitive Functioning Concentration: Normal Memory: Recent Intact, Remote Intact Is patient IDD: No Is patient DD?: No Insight: Poor Impulse Control: Poor Appetite: Good Sleep: No Change Vegetative Symptoms: None  ADLScreening [facility] Assessment Services) Patient's cognitive ability adequate to safely complete daily activities?: Yes Patient able to express need for assistance with ADLs?: Yes Independently performs ADLs?: Yes (appropriate for developmental age)  Prior Inpatient Therapy Prior Inpatient Therapy: No  Prior Outpatient Therapy Prior Outpatient Therapy: Yes Prior Therapy Dates: Ongoing Prior Therapy Facilty/Provider(s): Dr. Jennelle Humanottle Reason for  Treatment: Anxiety Does patient have an ACCT team?: No Does patient have Intensive In-House Services?  : No Does patient have [facility] services? : No Does patient have P4CC services?: No  ADL Screening (condition at time of admission) Patient's cognitive ability adequate to safely complete daily activities?: Yes Is the patient deaf or have difficulty hearing?: No Does the patient have difficulty seeing, even when wearing glasses/contacts?: No Does the patient have difficulty concentrating, remembering, or making decisions?: No Patient able to express need for assistance with ADLs?: Yes Does the patient have difficulty dressing or bathing?: No Independently performs ADLs?: Yes (appropriate for developmental age) Does the patient have difficulty walking or climbing stairs?: No Weakness of Legs: None Weakness of Arms/Hands: None  Home Assistive Devices/Equipment Home Assistive Devices/Equipment: None  Therapy Consults (therapy consults require a physician order) PT Evaluation Needed: No OT Evalulation Needed: No SLP Evaluation Needed: No Abuse/Neglect Assessment (Assessment to be complete while patient is alone) Abuse/Neglect Assessment Can Be Completed: Yes Physical Abuse: Denies Verbal Abuse: Denies Sexual Abuse: Denies Exploitation of patient/patient's resources: Denies Self-Neglect: Denies Values / Beliefs Cultural Requests During Hospitalization: None Spiritual Requests During Hospitalization: None Consults Spiritual Care Consult Needed: No Social Work Consult Needed: No Merchant navy officerAdvance Directives (For Healthcare) Does Patient Have a Medical Advance Directive?: No    Additional Information 1:1 In Past 12 Months?: No CIRT Risk: No Elopement Risk: No Does patient have medical clearance?: Yes     Disposition:  Disposition Initial Assessment Completed for this Encounter: Yes Patient referred to: Other (Comment)(Per L. Arville CareParks, NP, need labs; observe, am eval)  On Site  Evaluation  by:   Reviewed with Physician:    Dorris Fetch Brock Larmon 05/01/2018 3:53 PM

## 2018-05-01 NOTE — ED Notes (Signed)
Bed: [id number] Expected date:  Expected time:  Means of arrival:  Comments: Hold for ED 23

## 2018-05-01 NOTE — ED Notes (Signed)
Pt was oriented to unit. He says he hasn't had any SI since last night, when he reportedly overdosed on trazodone. He denied HI and AVH as well. He has been pleasant and cooperative. He contracts for safety. His wife is now visiting in his room. Will monitor for needs/safety.

## 2018-05-01 NOTE — ED Notes (Signed)
Pt signed for release of information for his wife, Ailene ArdsMartha Luevanos, [phone].

## 2018-05-01 NOTE — ED Notes (Signed)
Pt has been seen and wand by security.  Pt's belongings are in cabinet.

## 2018-05-02 ENCOUNTER — Other Ambulatory Visit: Payer: Self-pay

## 2018-05-02 ENCOUNTER — Encounter (HOSPITAL_COMMUNITY): Payer: Self-pay

## 2018-05-02 ENCOUNTER — Inpatient Hospital Stay (HOSPITAL_COMMUNITY)
Admission: AD | Admit: 2018-05-02 | Discharge: 2018-05-05 | DRG: 885 | Disposition: A | Discharge status: Home / Self Care | Payer: BLUE CROSS/BLUE SHIELD | Source: Intra-hospital | Attending: Psychiatry | Admitting: Psychiatry

## 2018-05-02 DIAGNOSIS — F419 Anxiety disorder, unspecified: Secondary | ICD-10-CM

## 2018-05-02 DIAGNOSIS — Z630 Problems in relationship with spouse or partner: Secondary | ICD-10-CM | POA: Diagnosis not present

## 2018-05-02 DIAGNOSIS — G2581 Restless legs syndrome: Secondary | ICD-10-CM | POA: Diagnosis present

## 2018-05-02 DIAGNOSIS — R450 Nervousness: Secondary | ICD-10-CM

## 2018-05-02 DIAGNOSIS — T1491XD Suicide attempt, subsequent encounter: Secondary | ICD-10-CM | POA: Diagnosis not present

## 2018-05-02 DIAGNOSIS — Z7289 Other problems related to lifestyle: Secondary | ICD-10-CM

## 2018-05-02 DIAGNOSIS — F4310 Post-traumatic stress disorder, unspecified: Secondary | ICD-10-CM | POA: Diagnosis present

## 2018-05-02 DIAGNOSIS — T1491XA Suicide attempt, initial encounter: Secondary | ICD-10-CM

## 2018-05-02 DIAGNOSIS — Z811 Family history of alcohol abuse and dependence: Secondary | ICD-10-CM | POA: Diagnosis not present

## 2018-05-02 DIAGNOSIS — Z882 Allergy status to sulfonamides status: Secondary | ICD-10-CM

## 2018-05-02 DIAGNOSIS — Z86711 Personal history of pulmonary embolism: Secondary | ICD-10-CM

## 2018-05-02 DIAGNOSIS — F1099 Alcohol use, unspecified with unspecified alcohol-induced disorder: Secondary | ICD-10-CM

## 2018-05-02 DIAGNOSIS — T43212A Poisoning by selective serotonin and norepinephrine reuptake inhibitors, intentional self-harm, initial encounter: Secondary | ICD-10-CM

## 2018-05-02 DIAGNOSIS — R45851 Suicidal ideations: Secondary | ICD-10-CM | POA: Diagnosis present

## 2018-05-02 DIAGNOSIS — F332 Major depressive disorder, recurrent severe without psychotic features: Secondary | ICD-10-CM | POA: Diagnosis present

## 2018-05-02 DIAGNOSIS — Z9149 Other personal history of psychological trauma, not elsewhere classified: Secondary | ICD-10-CM | POA: Diagnosis not present

## 2018-05-02 DIAGNOSIS — F329 Major depressive disorder, single episode, unspecified: Secondary | ICD-10-CM | POA: Diagnosis not present

## 2018-05-02 DIAGNOSIS — F1010 Alcohol abuse, uncomplicated: Secondary | ICD-10-CM | POA: Diagnosis present

## 2018-05-02 DIAGNOSIS — T43212D Poisoning by selective serotonin and norepinephrine reuptake inhibitors, intentional self-harm, subsequent encounter: Secondary | ICD-10-CM | POA: Diagnosis not present

## 2018-05-02 DIAGNOSIS — F411 Generalized anxiety disorder: Secondary | ICD-10-CM | POA: Diagnosis present

## 2018-05-02 DIAGNOSIS — R4587 Impulsiveness: Secondary | ICD-10-CM

## 2018-05-02 DIAGNOSIS — T50902A Poisoning by unspecified drugs, medicaments and biological substances, intentional self-harm, initial encounter: Secondary | ICD-10-CM | POA: Diagnosis not present

## 2018-05-02 DIAGNOSIS — Z62811 Personal history of psychological abuse in childhood: Secondary | ICD-10-CM | POA: Diagnosis not present

## 2018-05-02 DIAGNOSIS — Z635 Disruption of family by separation and divorce: Secondary | ICD-10-CM | POA: Diagnosis not present

## 2018-05-02 LAB — URINALYSIS, ROUTINE W REFLEX MICROSCOPIC
Bilirubin Urine: NEGATIVE
GLUCOSE, UA: NEGATIVE mg/dL
Hgb urine dipstick: NEGATIVE
KETONES UR: NEGATIVE mg/dL
Leukocytes, UA: NEGATIVE
Nitrite: NEGATIVE
PH: 6 (ref 5.0–8.0)
PROTEIN: NEGATIVE mg/dL
Specific Gravity, Urine: 1.023 (ref 1.005–1.030)

## 2018-05-02 LAB — RAPID URINE DRUG SCREEN, HOSP PERFORMED
AMPHETAMINES: NOT DETECTED
BARBITURATES: NOT DETECTED
Benzodiazepines: NOT DETECTED
Cocaine: NOT DETECTED
Opiates: NOT DETECTED
Tetrahydrocannabinol: NOT DETECTED

## 2018-05-02 MED ORDER — BUSPIRONE HCL 15 MG PO TABS
15.0000 mg | ORAL_TABLET | Freq: Every day | ORAL | Status: DC
  Administered 2018-05-02: 15 mg via ORAL
  Filled 2018-05-02 (×2): qty 1
  Filled 2018-05-02: qty 3
  Filled 2018-05-02: qty 1

## 2018-05-02 MED ORDER — SERTRALINE HCL 100 MG PO TABS
200.0000 mg | ORAL_TABLET | Freq: Every day | ORAL | Status: DC
  Administered 2018-05-03 – 2018-05-05 (×3): 200 mg via ORAL
  Filled 2018-05-02 (×6): qty 2

## 2018-05-02 MED ORDER — ACETAMINOPHEN 325 MG PO TABS
650.0000 mg | ORAL_TABLET | Freq: Four times a day (QID) | ORAL | Status: DC | PRN
  Administered 2018-05-03 – 2018-05-05 (×4): 650 mg via ORAL
  Filled 2018-05-02 (×4): qty 2

## 2018-05-02 MED ORDER — ALUM & MAG HYDROXIDE-SIMETH 200-200-20 MG/5ML PO SUSP: 30.0000 mL | ORAL | Status: DC | PRN

## 2018-05-02 MED ORDER — MAGNESIUM HYDROXIDE 400 MG/5ML PO SUSP: 30.0000 mL | Freq: Every day | ORAL | Status: DC | PRN

## 2018-05-02 MED ORDER — ROPINIROLE HCL 1 MG PO TABS
3.0000 mg | ORAL_TABLET | Freq: Every evening | ORAL | Status: DC
  Administered 2018-05-02 – 2018-05-04 (×3): 3 mg via ORAL
  Filled 2018-05-02 (×5): qty 3

## 2018-05-02 MED ORDER — BUSPIRONE HCL 15 MG PO TABS
15.0000 mg | ORAL_TABLET | Freq: Three times a day (TID) | ORAL | Status: DC
  Administered 2018-05-03 (×2): 15 mg via ORAL
  Filled 2018-05-02 (×7): qty 1

## 2018-05-02 MED ORDER — GABAPENTIN 100 MG PO CAPS
200.0000 mg | ORAL_CAPSULE | Freq: Every day | ORAL | Status: DC
  Administered 2018-05-02 – 2018-05-03 (×2): 200 mg via ORAL
  Filled 2018-05-02 (×4): qty 2

## 2018-05-02 MED ORDER — BUPROPION HCL ER (XL) 150 MG PO TB24
150.0000 mg | ORAL_TABLET | Freq: Every day | ORAL | Status: DC
  Administered 2018-05-03: 150 mg via ORAL
  Filled 2018-05-02 (×3): qty 1

## 2018-05-02 MED ORDER — ACAMPROSATE CALCIUM 333 MG PO TBEC
666.0000 mg | DELAYED_RELEASE_TABLET | Freq: Three times a day (TID) | ORAL | Status: DC
  Administered 2018-05-02 – 2018-05-05 (×10): 666 mg via ORAL
  Filled 2018-05-02 (×15): qty 2

## 2018-05-02 MED ORDER — SERTRALINE HCL 100 MG PO TABS
200.0000 mg | ORAL_TABLET | Freq: Every day | ORAL | Status: DC
  Administered 2018-05-02: 200 mg via ORAL
  Filled 2018-05-02 (×4): qty 2

## 2018-05-02 MED ORDER — HYDROXYZINE HCL 25 MG PO TABS
25.0000 mg | ORAL_TABLET | Freq: Three times a day (TID) | ORAL | Status: DC | PRN
  Administered 2018-05-02: 25 mg via ORAL
  Filled 2018-05-02: qty 1

## 2018-05-02 NOTE — Progress Notes (Signed)
Gary Harper is a 47 year old male pt admitted on involuntary basis. On admission, he denies any SI and is able to contract for safety while in the hospital. He reports that he has been having conflict with his wife and spoke about how she found him watching porn. 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 He is calm and cooperative on admission and does not display any overt signs or symptoms of withdrawal. He reports that he has a psychiatrist and a therapist and reports that he has been seeing them for the past 3 years and reports that he is prescribed medications and reports he takes all his medications as prescribed and reports that he feels that his medications are working fine and does not want any of them changed. He reports that he is living with his wife and reports that he is hopeful that he can return there after discharge. He was oriented to the unit and safety maintained.

## 2018-05-02 NOTE — Progress Notes (Signed)
Psychoeducational Group Note  Date:  05/02/2018 Time:  2159  Group Topic/Focus:  Wrap-Up Group:   The focus of this group is to help patients review their daily goal of treatment and discuss progress on daily workbooks.  Participation Level: Did Not Attend  Participation Quality:  Not Applicable  Affect:  Not Applicable  Cognitive:  Not Applicable  Insight:  Not Applicable  Engagement in Group: Not Applicable  Additional Comments:  The patient did not attend group since he was too upset following his family visit.   Hazle CocaGOODMAN, Margaretta Chittum S 05/02/2018, 9:59 PM

## 2018-05-02 NOTE — Progress Notes (Signed)
D   Pt is pleasant on approach and cooperative    Pt minimizing seriousness of taking overdose of pills     He is superficial and has limited insight A   Verbal support given   Medications administered and effectiveness monitored    Q 15 min checks R   Pt is safe at present

## 2018-05-02 NOTE — [facility] Suicide Risk Assessment (Signed)
[facility] Admission Suicide Risk Assessment   Nursing information obtained from:    Demographic factors:    Current Mental Status:    Loss Factors:    Historical Factors:    Risk Reduction Factors:     Total Time spent with patient: 45 minutes Principal Problem: <principal problem not specified> Diagnosis:   Patient Active Problem List   Diagnosis Date Noted  . MDD (major depressive disorder), recurrent severe, without psychosis (HCC) [F33.2] 05/02/2018  . Intentional drug overdose (HCC) [T50.902A]   . Suicidal ideation [R45.851]    Subjective Data: Patient is seen and examined.  Patient is a 47 year old male with a past psychiatric history significant for major depression, generalized anxiety, recent disclosure of photography addiction and alcohol use disorder who presented to the [facility] emergency department on 05/01/2018 after an intentional overdose of approximately 20 tablets of trazodone 100 mg.  The patient stated that he had had a long issue with pornography addiction, and last Thursday he was watching pornography in his home and his wife caught him.  He stated she immediately left the home.  He felt significant guilt about this and then took the overdose of trazodone.  His wife returned the next morning and found him unable to walk.  He was taken to the emergency room.  He was monitored there.  Psychiatric consultation was obtained.  Once he was medically stabilized he was transferred to our facility for evaluation and stabilization.  The patient admitted to a long-standing history of depression and anxiety.  He has been treated by local psychiatrist and therapist for several years.  He has been on multiple medications, and sertraline is 1 of the few that is been helpful.  He is already on 200 mg p.o. daily.  He also has had issues with alcohol.  This is been a contentious point with his wife.  They have been in counseling for this and other issues in the relationship.  He  recently tried to take 30 days without drinking, and was successful with that, but as soon as the 30 days were up he started drinking again.  He stated been drinking alcohol since he was age 35.  He was unsure how long he has been watching pornography.  He stated his psychiatrist is unaware of the issues with pornography.  He stated his therapist is unaware of his issues with pornography.  He also is being treated with EMDR for previous emotional trauma as a child.  He was admitted to the hospital for evaluation and stabilization.  Continued Clinical Symptoms:    The "Alcohol Use Disorders Identification Test", Guidelines for Use in Primary Care, Second Edition.  World Science writer [facility]). Score between 0-7:  no or low risk or alcohol related problems. Score between 8-15:  moderate risk of alcohol related problems. Score between 16-19:  high risk of alcohol related problems. Score 20 or above:  warrants further diagnostic evaluation for alcohol dependence and treatment.   CLINICAL FACTORS:   Severe Anxiety and/or Agitation Depression:   Anhedonia Comorbid alcohol abuse/dependence Hopelessness Impulsivity Insomnia Alcohol/Substance Abuse/Dependencies More than one psychiatric diagnosis   Musculoskeletal: Strength & Muscle Tone: within normal limits Gait & Station: normal Patient leans: N/A  Psychiatric Specialty Exam: Physical Exam  Nursing note and vitals reviewed. Constitutional: He is oriented to person, place, and time. He appears well-developed and well-nourished.  HENT:  Head: Atraumatic.  Respiratory: Effort normal.  Musculoskeletal: Normal range of motion.  Neurological: He is alert and oriented  to person, place, and time.    ROS  There were no vitals taken for this visit.There is no height or weight on file to calculate BMI.  General Appearance: Casual  Eye Contact:  Good  Speech:  Normal Rate  Volume:  Normal  Mood:  Anxious and Depressed  Affect:  Congruent   Thought Process:  Coherent  Orientation:  Full (Time, Place, and Person)  Thought Content:  Logical  Suicidal Thoughts:  Yes.  without intent/plan  Homicidal Thoughts:  No  Memory:  Immediate;   Fair Recent;   Fair Remote;   Fair  Judgement:  Intact  Insight:  Fair  Psychomotor Activity:  Increased  Concentration:  Concentration: Fair and Attention Span: Fair  Recall:  [employer]  Fund of Knowledge:  Good  Language:  Good  Akathisia:  Negative  Handed:  Right  AIMS (if indicated):     Assets:  Communication Skills Desire for Improvement Financial Resources/Insurance Housing Physical Health Resilience Talents/Skills Transportation  ADL's:  Intact  Cognition:  WNL  Sleep:         COGNITIVE FEATURES THAT CONTRIBUTE TO RISK:  None    SUICIDE RISK:   Moderate:  Frequent suicidal ideation with limited intensity, and duration, some specificity in terms of plans, no associated intent, good self-control, limited dysphoria/symptomatology, some risk factors present, and identifiable protective factors, including available and accessible social support.  PLAN OF CARE: Patient is seen and examined.  Patient is a 47 year old male with a past psychiatric history significant for generalized anxiety disorder, major depression, alcohol use disorder as well as pornography addiction.  Patient had a significant trazodone overdose.  He is been monitored and is been considered medically stable.  He was transferred to our facility.  He is essentially willing to try and do anything to recover.  He is on 200 mg p.o. daily of sertraline, and has been on multiple medications before.  We discussed the possibility of Remeron because of his sleep issue, but he is very concerned about weight gain.  I will try to augment the sertraline with Wellbutrin XL 150 mg p.o. daily.  He has not gotten his sertraline today, and we will give that to him.  We will start the Wellbutrin tomorrow.  He is also on BuSpar and I will  consider increasing that dose.  I have also told him to contact family or friends to remove all tomography, weapons and alcohol from his home.  He is in agreement with that.  We discussed possible treatment with Topamax to assist with his pornography addiction, but will hold off on that.  He is interested in trying acamprosate.  We will start 335 mg p.o. twice daily and will be titrated throughout the course of the hospitalization.  He will be integrated into the milieu.  He will be encouraged to attend groups.  He will meet with social work both individually and in groups.  He will be placed on 15-minute checks.  We will collect collateral information from wife and family.  I certify that inpatient services furnished can reasonably be expected to improve the patient's condition.   Antonieta PertGreg Lawson Eulonda Andalon, MD 05/02/2018, 3:41 PM

## 2018-05-02 NOTE — Consult Note (Addendum)
[facility] Consult   Reason for Consult:  Intentional overdose Referring Physician:  EDP Patient Identification: Gary Harper MRN:  [id number] Principal Diagnosis: Intentional drug overdose [facility]) Diagnosis:   Patient Active Problem List   Diagnosis Date Noted  . Intentional drug overdose ([place]) [T50.902A]   . Suicidal ideation [R45.851]     Total Time spent with patient: 45 minutes  Subjective:   Gary Harper is a 47 y.o. male patient admitted with intentional overdose.  HPI:  Pt was seen and chart reviewed with treatment team and Dr Mariea Clonts. Pt stated he and his wife have been having marital problems and were in an argument and she left. After she left he took 20 tablets of Trazodone 100 mg in an attempt to end his life. Pt stated he has never done anything like this before. Pt stated he thinks he and his wife will get back together. Pt also stated he has been drinking moderate amounts of alcohol. Pt's UDS and BAL were negative. Pt's wife found him at home and he was difficult to arouse and he was sent to the [facility] for evaluation. Pt appears euthymic today but endorses depression and anxiety. Pt is followed by Dr Clovis Pu for his anxiety. Pt would benefit from an inpatient psychiatric admission for medication management and crisis stabilization.   Past Psychiatric History: As above  Risk to Self: Yes given recent suicide attempt.  Risk to Others: Homicidal Ideation: No Thoughts of Harm to Others: No Current Homicidal Intent: No Current Homicidal Plan: No Access to Homicidal Means: No History of harm to others?: No Assessment of Violence: None Noted Does patient have access to weapons?: No Criminal Charges Pending?: No Does patient have a court date: No Prior Inpatient Therapy: Prior Inpatient Therapy: No Prior Outpatient Therapy: Prior Outpatient Therapy: Yes Prior Therapy Dates: Ongoing Prior Therapy Facilty/Provider(s): Dr. Clovis Pu Reason for Treatment:  Anxiety Does patient have an ACCT team?: No Does patient have Intensive In-House Services?  : No Does patient have [facility] services? : No Does patient have P4CC services?: No  Past Medical History:  Past Medical History:  Diagnosis Date  . Anxiety   . PE (pulmonary thromboembolism) ([place])     Past Surgical History:  Procedure Laterality Date  . ANKLE RECONSTRUCTION    . BACK SURGERY     Family History: No family history on file. Family Psychiatric  History: Unknown Social History:  Social History   Substance and Sexual Activity  Alcohol Use Yes   Comment: BAC not available at time of assessment     Social History   Substance and Sexual Activity  Drug Use Never   Comment: UDS not available at time of assessment    Social History   Socioeconomic History  . Marital status: Single    Spouse name: Not on file  . Number of children: Not on file  . Years of education: Not on file  . Highest education level: Not on file  Occupational History  . Not on file  Social Needs  . Financial resource strain: Not on file  . Food insecurity:    Worry: Not on file    Inability: Not on file  . Transportation needs:    Medical: Not on file    Non-medical: Not on file  Tobacco Use  . Smoking status: Never Smoker  . Smokeless tobacco: Never Used  Substance and Sexual Activity  . Alcohol use: Yes    Comment: BAC not available at time of  assessment  . Drug use: Never    Comment: UDS not available at time of assessment  . Sexual activity: Not Currently  Lifestyle  . Physical activity:    Days per week: Not on file    Minutes per session: Not on file  . Stress: Not on file  Relationships  . Social connections:    Talks on phone: Not on file    Gets together: Not on file    Attends religious service: Not on file    Active member of club or organization: Not on file    Attends meetings of clubs or organizations: Not on file    Relationship status: Not on file  Other Topics  Concern  . Not on file  Social History Narrative  . Not on file   Additional Social History: N/A    Allergies:   Allergies  Allergen Reactions  . Sulfa Antibiotics Rash    Labs:  Results for orders placed or performed during the hospital encounter of 05/01/18 (from the past 48 hour(s))  CBG monitoring, ED     Status: Abnormal   Collection Time: 05/01/18  2:08 PM  Result Value Ref Range   Glucose-Capillary 113 (H) 65 - 99 mg/dL  Urinalysis, Routine w reflex microscopic     Status: None   Collection Time: 05/01/18  2:18 PM  Result Value Ref Range   Color, Urine YELLOW YELLOW   APPearance CLEAR CLEAR   Specific Gravity, Urine 1.023 1.005 - 1.030   pH 6.0 5.0 - 8.0   Glucose, UA NEGATIVE NEGATIVE mg/dL   Hgb urine dipstick NEGATIVE NEGATIVE   Bilirubin Urine NEGATIVE NEGATIVE   Ketones, ur NEGATIVE NEGATIVE mg/dL   Protein, ur NEGATIVE NEGATIVE mg/dL   Nitrite NEGATIVE NEGATIVE   Leukocytes, UA NEGATIVE NEGATIVE    Comment: Performed at [facility] [address], [place], [place] [address]  Urine rapid drug screen (hosp performed)     Status: None   Collection Time: 05/01/18  2:18 PM  Result Value Ref Range   Opiates NONE DETECTED NONE DETECTED   Cocaine NONE DETECTED NONE DETECTED   Benzodiazepines NONE DETECTED NONE DETECTED   Amphetamines NONE DETECTED NONE DETECTED   Tetrahydrocannabinol NONE DETECTED NONE DETECTED   Barbiturates NONE DETECTED NONE DETECTED    Comment: (NOTE) DRUG SCREEN FOR MEDICAL PURPOSES ONLY.  IF CONFIRMATION IS NEEDED FOR ANY PURPOSE, NOTIFY LAB WITHIN 5 DAYS. LOWEST DETECTABLE LIMITS FOR URINE DRUG SCREEN Drug Class                     Cutoff (ng/mL) Amphetamine and metabolites    1000 Barbiturate and metabolites    200 Benzodiazepine                 993 Tricyclics and metabolites     300 Opiates and metabolites        300 Cocaine and metabolites        300 THC                            50 Performed at [facility], [place] [address] [place], [place] [address]   Comprehensive metabolic panel     Status: Abnormal   Collection Time: 05/01/18  2:18 PM  Result Value Ref Range   Sodium 140 135 - 145 mmol/L   Potassium 4.2 3.5 - 5.1 mmol/L   Chloride 106 101 - 111  mmol/L   CO2 24 22 - 32 mmol/L   Glucose, Bld 113 (H) 65 - 99 mg/dL   BUN 13 6 - 20 mg/dL   Creatinine, Ser 1.00 0.61 - 1.24 mg/dL   Calcium 8.9 8.9 - 10.3 mg/dL   Total Protein 7.2 6.5 - 8.1 g/dL   Albumin 4.3 3.5 - 5.0 g/dL   AST 30 15 - 41 U/L   ALT 25 17 - 63 U/L   Alkaline Phosphatase 42 38 - 126 U/L   Total Bilirubin 0.4 0.3 - 1.2 mg/dL   GFR calc non Af Amer >60 >60 mL/min   GFR calc Af Amer >60 >60 mL/min    Comment: (NOTE) The eGFR has been calculated using the CKD EPI equation. This calculation has not been validated in all clinical situations. eGFR's persistently <60 mL/min signify possible Chronic Kidney Disease.    Anion gap 10 5 - 15    Comment: Performed at [facility], [place] [address], [place], [place] [address]  Ethanol     Status: None   Collection Time: 05/01/18  2:18 PM  Result Value Ref Range   Alcohol, Ethyl (B) <10 <10 mg/dL    Comment: (NOTE) Lowest detectable limit for serum alcohol is 10 mg/dL. For medical purposes only. Performed at [facility], [place] [address] [place], [place] [address]   Acetaminophen level     Status: Abnormal   Collection Time: 05/01/18  2:18 PM  Result Value Ref Range   Acetaminophen (Tylenol), Serum <10 (L) 10 - 30 ug/mL    Comment: (NOTE) Therapeutic concentrations vary significantly. A range of 10-30 ug/mL  may be an effective concentration for many patients. However, some  are best treated at concentrations outside of this range. Acetaminophen concentrations >150 ug/mL at 4 hours after ingestion  and >50 ug/mL at 12 hours after ingestion are often associated with  toxic reactions. Performed at [facility], [place] [address] [place], [place] [address]   Salicylate level     Status: None   Collection Time: 05/01/18  2:18 PM  Result Value Ref Range   Salicylate Lvl <4.7 2.8 - 30.0 mg/dL    Comment: Performed at [facility], [place] [address], [place], [place] [address]  CBC with Differential     Status: None   Collection Time: 05/01/18  2:18 PM  Result Value Ref Range   WBC 5.9 4.0 - 10.5 K/uL   RBC 4.40 4.22 - 5.81 MIL/uL   Hemoglobin 13.7 13.0 - 17.0 g/dL   HCT 40.4 39.0 - 52.0 %   MCV 91.8 78.0 - 100.0 fL   MCH 31.1 26.0 - 34.0 pg   MCHC 33.9 30.0 - 36.0 g/dL   RDW 13.0 11.5 - 15.5 %   Platelets 232 150 - 400 K/uL   Neutrophils Relative % 78 %   Neutro Abs 4.5 1.7 - 7.7 K/uL   Lymphocytes Relative 16 %   Lymphs Abs 1.0 0.7 - 4.0 K/uL   Monocytes Relative 6 %   Monocytes Absolute 0.4 0.1 - 1.0 K/uL   Eosinophils Relative 0 %   Eosinophils Absolute 0.0 0.0 - 0.7 K/uL   Basophils Relative 0 %   Basophils Absolute 0.0 0.0 - 0.1 K/uL    Comment: Performed at [facility], [place] [address], [place],  [address]    Current Facility-Administered Medications  Medication Dose Route Frequency Provider Last Rate Last Dose  . LORazepam (ATIVAN) injection  0-4 mg  0-4 mg Intravenous Q6H Valarie Merino, MD       Or  . LORazepam (ATIVAN) tablet 0-4 mg  0-4 mg Oral Q6H Valarie Merino, MD   1 mg at 05/01/18 1634  . [START ON 05/03/2018] LORazepam (ATIVAN) injection 0-4 mg  0-4 mg Intravenous Q12H Valarie Merino, MD       Or  . Derrill Memo ON 05/03/2018] LORazepam (ATIVAN) tablet 0-4 mg  0-4 mg Oral Q12H Valarie Merino, MD      . thiamine (VITAMIN B-1) tablet 100 mg  100 mg Oral Daily Valarie Merino, MD   100 mg at 05/02/18 9381   Or  . thiamine (B-1) injection 100 mg  100 mg Intravenous Daily Valarie Merino, MD       Current Outpatient Medications  Medication Sig Dispense Refill  . busPIRone (BUSPAR) 15 MG tablet Take 15 mg by  mouth daily.   0  . LORazepam (ATIVAN) 1 MG tablet Take 1 mg by mouth daily as needed for anxiety or sleep.   0  . rOPINIRole (REQUIP) 3 MG tablet Take 3 mg by mouth every evening.  1  . sertraline (ZOLOFT) 100 MG tablet Take 200 mg by mouth daily.  0  . traZODone (DESYREL) 100 MG tablet Take 100 mg by mouth at bedtime.  1    Musculoskeletal: Strength & Muscle Tone: within normal limits Gait & Station: normal Patient leans: N/A  Psychiatric Specialty Exam: Physical Exam  Nursing note and vitals reviewed. Constitutional: He is oriented to person, place, and time. He appears well-developed and well-nourished.  HENT:  Head: Normocephalic and atraumatic.  Neck: Normal range of motion.  Respiratory: Effort normal.  Musculoskeletal: Normal range of motion.  Neurological: He is alert and oriented to person, place, and time.  Psychiatric: His speech is normal and behavior is normal. Thought content normal. Cognition and memory are normal. He expresses impulsivity. He exhibits a depressed mood.    Review of Systems  Psychiatric/Behavioral: Positive for depression and suicidal ideas. Negative for hallucinations, memory loss and substance abuse. The patient is nervous/anxious. The patient does not have insomnia.   All other systems reviewed and are negative.   Blood pressure 121/69, pulse 77, temperature 98.2 F (36.8 C), temperature source Oral, resp. rate 14, height _0  (1.854 m), weight 210 lb (95.3 kg), SpO2 95 %.Body mass index is 27.71 kg/m.  General Appearance: Casual  Eye Contact:  Good  Speech:  Clear and Coherent  Volume:  Normal  Mood:  Anxious and Depressed  Affect:  Congruent and Depressed  Thought Process:  Coherent, Goal Directed and Linear  Orientation:  Full (Time, Place, and Person)  Thought Content:  Logical  Suicidal Thoughts:  No  Homicidal Thoughts:  No  Memory:  Immediate;   Good Recent;   Good Remote;   Fair  Judgement:  Fair  Insight:  Fair  Psychomotor  Activity:  Normal  Concentration:  Concentration: Good and Attention Span: Good  Recall:  Good  Fund of Knowledge:  Good  Language:  Good  Akathisia:  No  Handed:  Right  AIMS (if indicated):   N/A  Assets:  Agricultural consultant Housing  ADL's:  Intact  Cognition:  WNL  Sleep:   Good      Treatment Plan Summary: Daily contact with patient to assess and evaluate symptoms and progress in treatment and Medication management (see MAR )  Disposition: Recommend psychiatric Inpatient admission when  medically cleared. TTS to seek placement   Ethelene Hal, NP 05/02/2018 1:12 PM   Patient seen face-to-face for psychiatric evaluation, chart reviewed and case discussed with the physician extender and developed treatment plan. Reviewed the information documented and agree with the treatment plan.  Buford Dresser, DO 05/02/18 5:27 PM

## 2018-05-02 NOTE — ED Notes (Signed)
[facility] called for transport 

## 2018-05-02 NOTE — ED Notes (Signed)
[facility] on unit to transfer pt to [facility] Adult unit per MD order. Personal property given to [employer] for transfer. Pt ambulatory off unit in police custody.  

## 2018-05-02 NOTE — ED Notes (Signed)
Specimen cup provided

## 2018-05-02 NOTE — Tx Team (Signed)
Initial Treatment Plan 05/02/2018 4:10 PM Gary Manilahilip M Mccleese ONG:[id number]    PATIENT STRESSORS: Marital or family conflict   PATIENT STRENGTHS: Ability for insight Average or above average intelligence Capable of independent living Communication skills General fund of knowledge Motivation for treatment/growth   PATIENT IDENTIFIED PROBLEMS: Anxiety Depression Suicidal thoughts "I have an addiction to porn and I may have an alcohol problem"                     DISCHARGE CRITERIA:  Ability to meet basic life and health needs Improved stabilization in mood, thinking, and/or behavior Reduction of life-threatening or endangering symptoms to within safe limits Verbal commitment to aftercare and medication compliance  PRELIMINARY DISCHARGE PLAN: Attend aftercare/continuing care group Return to previous living arrangement  PATIENT/FAMILY INVOLVEMENT: This treatment plan has been presented to and reviewed with the patient, Gary Harper, and/or family member, .  The patient and family have been given the opportunity to ask questions and make suggestions.  Thaddeus Evitts, [place], [place] 05/02/2018, 4:10 PM

## 2018-05-02 NOTE — ED Notes (Signed)
Pt talking on hallway phone.  

## 2018-05-02 NOTE — [facility] Group Notes (Signed)
[facility] Group Notes:  (Nursing/MHT/Case Management/Adjunct)  Date:  05/02/2018  Time:  4:52 PM  Type of Therapy:  Psychoeducational Skills  Participation Level:  Active  Participation Quality:  Appropriate and Attentive  Affect:  Appropriate  Cognitive:  Alert and Oriented  Insight:  Improving  Engagement in Group:  Engaged  Modes of Intervention:  Activity, Discussion and Education  Summary of Progress/Problems: Pt was an active participant in group, supported peers. Pt was able to identify one coping skill for self care to work on post discharge.   Brett AlbinoLandis, Kathey Simer E 05/02/2018, 4:52 PM

## 2018-05-02 NOTE — H&P (Signed)
Psychiatric Admission Assessment Adult  Patient Identification: Gary Harper MRN:  [id number] Date of Evaluation:  05/02/2018 Chief Complaint:  MDD Principal Diagnosis: <principal problem not specified> Diagnosis:   Patient Active Problem List   Diagnosis Date Noted  . MDD (major depressive disorder), recurrent severe, without psychosis ([place]) [F33.2] 05/02/2018  . Intentional drug overdose ([place]) [T50.902A]   . Suicidal ideation [R45.851]    History of Present Illness: Patient is seen and examined.  Patient is a 47 year old male with a known past psychiatric history significant for major depression, generalized anxiety disorder, posttraumatic stress disorder and recent disclosure of pornography addiction as well as alcohol use disorder.  He originally presented to the [facility] emergency department on 05/01/2018 after an intentional overdose of approximately 20 tablets of trazodone 100 mg.  The patient stated that he had had a long history with sonography addiction, but unfortunately last Thursday was the first time his wife had caught him watching podagra fee in their home.  She was very upset about this and left the home.  He felt significantly guilty about this and took the overdose of trazodone.  His wife returned the next morning and found him unable to walk.  He was taken to the emergency room.  He was monitored there.  Psychiatric consultation was obtained.  Once he was medically stabilized he was transferred to our facility for evaluation and stabilization.  The patient admitted to a long-standing history of depression, anxiety and previous emotional trauma.  He is currently being treated by a local psychiatrist and therapist for several years.  They were unaware of the pornography addiction.  He has been treated with multiple antidepressants in the past, but none were successful except the sertraline.  He is currently on sertraline 200 mg p.o. daily.  He also takes BuSpar.   He had been taking trazodone for sleep.  He is also being treated with EMDR for his PTSD.  He admitted to helplessness, hopelessness and worthlessness.  He stated he was trying to kill himself.  His alcohol is been a issue with his wife, and they have been in counseling for some time.  He was supposed to have an assessment for alcohol dependence by someone in that same group this week.  He stated that last month he went 30 days without any alcohol without any withdrawal symptoms.  He stated that once he completed the 30 days he just went back to drinking.  He has a significant family history of alcohol dependence.  He was admitted to the hospital for evaluation and stabilization. Associated Signs/Symptoms: Depression Symptoms:  depressed mood, anhedonia, insomnia, psychomotor agitation, fatigue, feelings of worthlessness/guilt, difficulty concentrating, hopelessness, suicidal thoughts with specific plan, suicidal attempt, anxiety, loss of energy/fatigue, disturbed sleep, (Hypo) Manic Symptoms:  Impulsivity, Anxiety Symptoms:  Excessive Worry, Psychotic Symptoms:  Denied PTSD Symptoms: Had a traumatic exposure:  Emotional trauma as a child Total Time spent with patient: 45 minutes  Past Psychiatric History: Patient has been seen by an outpatient psychiatrist and therapist for several years.  He has been treated with multiple medications without great success.  Sertraline is been the best.  He has not been hospitalized previously.  Is the patient at risk to self? Yes.    Has the patient been a risk to self in the past 6 months? Yes.    Has the patient been a risk to self within the distant past? No.  Is the patient a risk to others? No.  Has  the patient been a risk to others in the past 6 months? No.  Has the patient been a risk to others within the distant past? No.   Prior Inpatient Therapy:   Prior Outpatient Therapy:    Alcohol Screening:   Substance Abuse History in the last 12  months:  Yes.   Consequences of Substance Abuse: Family Consequences:  Marital therapy and current separation Previous Psychotropic Medications: Yes  Psychological Evaluations: Yes  Past Medical History:  Past Medical History:  Diagnosis Date  . Anxiety   . PE (pulmonary thromboembolism) ([place])     Past Surgical History:  Procedure Laterality Date  . ANKLE RECONSTRUCTION    . BACK SURGERY     Family History: History reviewed. No pertinent family history. Family Psychiatric  History: Patient stated several of his siblings are alcoholics Tobacco Screening:   Social History:  Social History   Substance and Sexual Activity  Alcohol Use Yes     Social History   Substance and Sexual Activity  Drug Use Never   Comment: UDS not available at time of assessment    Additional Social History:                           Allergies:   Allergies  Allergen Reactions  . Sulfa Antibiotics Rash   Lab Results:  Results for orders placed or performed during the hospital encounter of 05/01/18 (from the past 48 hour(s))  CBG monitoring, ED     Status: Abnormal   Collection Time: 05/01/18  2:08 PM  Result Value Ref Range   Glucose-Capillary 113 (H) 65 - 99 mg/dL  Urinalysis, Routine w reflex microscopic     Status: None   Collection Time: 05/01/18  2:18 PM  Result Value Ref Range   Color, Urine YELLOW YELLOW   APPearance CLEAR CLEAR   Specific Gravity, Urine 1.023 1.005 - 1.030   pH 6.0 5.0 - 8.0   Glucose, UA NEGATIVE NEGATIVE mg/dL   Hgb urine dipstick NEGATIVE NEGATIVE   Bilirubin Urine NEGATIVE NEGATIVE   Ketones, ur NEGATIVE NEGATIVE mg/dL   Protein, ur NEGATIVE NEGATIVE mg/dL   Nitrite NEGATIVE NEGATIVE   Leukocytes, UA NEGATIVE NEGATIVE    Comment: Performed at [facility] [address], [place], [place] [address]  Urine rapid drug screen (hosp performed)     Status: None   Collection Time: 05/01/18  2:18 PM  Result Value Ref Range   Opiates  NONE DETECTED NONE DETECTED   Cocaine NONE DETECTED NONE DETECTED   Benzodiazepines NONE DETECTED NONE DETECTED   Amphetamines NONE DETECTED NONE DETECTED   Tetrahydrocannabinol NONE DETECTED NONE DETECTED   Barbiturates NONE DETECTED NONE DETECTED    Comment: (NOTE) DRUG SCREEN FOR MEDICAL PURPOSES ONLY.  IF CONFIRMATION IS NEEDED FOR ANY PURPOSE, NOTIFY LAB WITHIN 5 DAYS. LOWEST DETECTABLE LIMITS FOR URINE DRUG SCREEN Drug Class                     Cutoff (ng/mL) Amphetamine and metabolites    1000 Barbiturate and metabolites    200 Benzodiazepine                 007 Tricyclics and metabolites     300 Opiates and metabolites        300 Cocaine and metabolites        300 THC  50 Performed at [facility], [place] [address], [place], [place] [address]   Comprehensive metabolic panel     Status: Abnormal   Collection Time: 05/01/18  2:18 PM  Result Value Ref Range   Sodium 140 135 - 145 mmol/L   Potassium 4.2 3.5 - 5.1 mmol/L   Chloride 106 101 - 111 mmol/L   CO2 24 22 - 32 mmol/L   Glucose, Bld 113 (H) 65 - 99 mg/dL   BUN 13 6 - 20 mg/dL   Creatinine, Ser 1.00 0.61 - 1.24 mg/dL   Calcium 8.9 8.9 - 10.3 mg/dL   Total Protein 7.2 6.5 - 8.1 g/dL   Albumin 4.3 3.5 - 5.0 g/dL   AST 30 15 - 41 U/L   ALT 25 17 - 63 U/L   Alkaline Phosphatase 42 38 - 126 U/L   Total Bilirubin 0.4 0.3 - 1.2 mg/dL   GFR calc non Af Amer >60 >60 mL/min   GFR calc Af Amer >60 >60 mL/min    Comment: (NOTE) The eGFR has been calculated using the CKD EPI equation. This calculation has not been validated in all clinical situations. eGFR's persistently <60 mL/min signify possible Chronic Kidney Disease.    Anion gap 10 5 - 15    Comment: Performed at [facility], [place] [address] [place], [place] [address]  Ethanol     Status: None   Collection Time: 05/01/18  2:18 PM  Result Value Ref Range   Alcohol, Ethyl (B) <10 <10 mg/dL     Comment: (NOTE) Lowest detectable limit for serum alcohol is 10 mg/dL. For medical purposes only. Performed at [facility], [place] [address], [place], [place] [address]   Acetaminophen level     Status: Abnormal   Collection Time: 05/01/18  2:18 PM  Result Value Ref Range   Acetaminophen (Tylenol), Serum <10 (L) 10 - 30 ug/mL    Comment: (NOTE) Therapeutic concentrations vary significantly. A range of 10-30 ug/mL  may be an effective concentration for many patients. However, some  are best treated at concentrations outside of this range. Acetaminophen concentrations >150 ug/mL at 4 hours after ingestion  and >50 ug/mL at 12 hours after ingestion are often associated with  toxic reactions. Performed at [facility], [place] [address], [place], [place] [address]   Salicylate level     Status: None   Collection Time: 05/01/18  2:18 PM  Result Value Ref Range   Salicylate Lvl <0.1 2.8 - 30.0 mg/dL    Comment: Performed at [facility], [place] [address] [place],  [address]  CBC with Differential     Status: None   Collection Time: 05/01/18  2:18 PM  Result Value Ref Range   WBC 5.9 4.0 - 10.5 K/uL   RBC 4.40 4.22 - 5.81 MIL/uL   Hemoglobin 13.7 13.0 - 17.0 g/dL   HCT 40.4 39.0 - 52.0 %   MCV 91.8 78.0 - 100.0 fL   MCH 31.1 26.0 - 34.0 pg   MCHC 33.9 30.0 - 36.0 g/dL   RDW 13.0 11.5 - 15.5 %   Platelets 232 150 - 400 K/uL   Neutrophils Relative % 78 %   Neutro Abs 4.5 1.7 - 7.7 K/uL   Lymphocytes Relative 16 %   Lymphs Abs 1.0 0.7 - 4.0 K/uL   Monocytes Relative 6 %   Monocytes Absolute 0.4 0.1 - 1.0 K/uL   Eosinophils Relative 0 %  Eosinophils Absolute 0.0 0.0 - 0.7 K/uL   Basophils Relative 0 %   Basophils Absolute 0.0 0.0 - 0.1 K/uL    Comment: Performed at [facility], [place] [address] [place], [place] [address]    Blood Alcohol level:  Lab Results  Component Value Date   ETH <10  13/06/6577    Metabolic Disorder Labs:  No results found for: HGBA1C, MPG No results found for: PROLACTIN No results found for: CHOL, TRIG, HDL, CHOLHDL, VLDL, LDLCALC  Current Medications: Current Facility-Administered Medications  Medication Dose Route Frequency Provider Last Rate Last Dose  . acamprosate (CAMPRAL) tablet 666 mg  666 mg Oral TID WC Sharma Covert, MD      . acetaminophen (TYLENOL) tablet 650 mg  650 mg Oral Q6H PRN Ethelene Hal, NP      . alum & mag hydroxide-simeth (MAALOX/MYLANTA) 200-200-20 MG/5ML suspension 30 mL  30 mL Oral Q4H PRN Ethelene Hal, NP      . Derrill Memo ON 05/03/2018] buPROPion (WELLBUTRIN XL) 24 hr tablet 150 mg  150 mg Oral Daily Sharma Covert, MD      . Derrill Memo ON 05/03/2018] busPIRone (BUSPAR) tablet 15 mg  15 mg Oral TID Sharma Covert, MD      . gabapentin (NEURONTIN) capsule 200 mg  200 mg Oral QHS Sharma Covert, MD      . hydrOXYzine (ATARAX/VISTARIL) tablet 25 mg  25 mg Oral TID PRN Ethelene Hal, NP      . magnesium hydroxide (MILK OF MAGNESIA) suspension 30 mL  30 mL Oral Daily PRN Ethelene Hal, NP      . rOPINIRole (REQUIP) tablet 3 mg  3 mg Oral QPM Ethelene Hal, NP      . Derrill Memo ON 05/03/2018] sertraline (ZOLOFT) tablet 200 mg  200 mg Oral Daily Sharma Covert, MD       PTA Medications: Medications Prior to Admission  Medication Sig Dispense Refill Last Dose  . busPIRone (BUSPAR) 15 MG tablet Take 15 mg by mouth daily.   0 Past Week at Unknown time  . LORazepam (ATIVAN) 1 MG tablet Take 1 mg by mouth daily as needed for anxiety or sleep.   0 Over a month ago  . rOPINIRole (REQUIP) 3 MG tablet Take 3 mg by mouth every evening.  1 Past Week at Unknown time  . sertraline (ZOLOFT) 100 MG tablet Take 200 mg by mouth daily.  0 04/30/2018 at Unknown time  . traZODone (DESYREL) 100 MG tablet Take 100 mg by mouth at bedtime.  1 04/30/2018 at Unknown time    Musculoskeletal: Strength &  Muscle Tone: within normal limits Gait & Station: normal Patient leans: N/A  Psychiatric Specialty Exam: Physical Exam  Nursing note and vitals reviewed. Constitutional: He is oriented to person, place, and time. He appears well-developed and well-nourished.  HENT:  Head: Normocephalic and atraumatic.  Respiratory: Effort normal.  Musculoskeletal: Normal range of motion.  Neurological: He is alert and oriented to person, place, and time.    ROS  There were no vitals taken for this visit.There is no height or weight on file to calculate BMI.  General Appearance: Casual  Eye Contact:  Good  Speech:  Normal Rate  Volume:  Normal  Mood:  Anxious and Depressed  Affect:  Congruent  Thought Process:  Coherent  Orientation:  Full (Time, Place, and Person)  Thought Content:  Logical  Suicidal Thoughts:  Yes.  without intent/plan  Homicidal Thoughts:  No  Memory:  Immediate;   Fair Recent;   Fair Remote;   Fair  Judgement:  Intact  Insight:  Fair  Psychomotor Activity:  Normal  Concentration:  Concentration: Fair and Attention Span: Fair  Recall:  [employer] of Knowledge:  Good  Language:  Good  Akathisia:  Negative  Handed:  Right  AIMS (if indicated):     Assets:  Communication Skills Desire for Improvement Financial Resources/Insurance Housing Leisure Time [place] Talents/Skills Transportation  ADL's:  Intact  Cognition:  WNL  Sleep:       Treatment Plan Summary: Daily contact with patient to assess and evaluate symptoms and progress in treatment, Medication management and Plan Patient is seen and examined.  Patient is a 47 year old male with the above-stated past psychiatric history who was admitted after an intentional overdose of trazodone.  He will be admitted to the unit.  He will be integrated into the milieu.  He will be encouraged to attend groups.  He will develop coping skills.  He will be on 15-minute checks.  His wife will  be contacted for collateral information.  I have requested that he remove all items of podagra fee, alcohol and weapons from his home.  He agrees with this.  He has been on sertraline 200 mg p.o. daily for an extended period of time, and has had multiple failures with other antidepressants.  We discussed options including Remeron.  What we will do is try Wellbutrin XL 150 mg p.o. daily to augment the sertraline.  Additionally we will place acamprosate on board at 333 mg p.o. 3 times daily for alcohol cravings.  We also discussed possible adding Topamax for pornography addiction, but will hold off and let Dr. Parke Poisson discussed that with the patient.  I will go on and increase his BuSpar to 15 mg p.o. 3 times daily.  I will avoid the trazodone give the overdose, and I am going to put 200 mg of Neurontin on board at bedtime, and we can titrate that.  Observation Level/Precautions:  15 minute checks  Laboratory:  Chemistry Profile  Psychotherapy:    Medications:    Consultations:    Discharge Concerns:    Estimated LOS:  Other:     Physician Treatment Plan for Primary Diagnosis: <principal problem not specified> Long Term Goal(s): Improvement in symptoms so as ready for discharge  Short Term Goals: Ability to identify changes in lifestyle to reduce recurrence of condition will improve, Ability to verbalize feelings will improve, Ability to disclose and discuss suicidal ideas, Ability to demonstrate self-control will improve, Ability to identify and develop effective coping behaviors will improve, Ability to maintain clinical measurements within normal limits will improve, Compliance with prescribed medications will improve and Ability to identify triggers associated with substance abuse/mental health issues will improve  Physician Treatment Plan for Secondary Diagnosis: Active Problems:   MDD (major depressive disorder), recurrent severe, without psychosis ([place])  Long Term Goal(s): Improvement in  symptoms so as ready for discharge  Short Term Goals: Ability to identify changes in lifestyle to reduce recurrence of condition will improve, Ability to verbalize feelings will improve, Ability to disclose and discuss suicidal ideas, Ability to demonstrate self-control will improve, Ability to identify and develop effective coping behaviors will improve, Ability to maintain clinical measurements within normal limits will improve, Compliance with prescribed medications will improve and Ability to identify triggers associated with substance abuse/mental health issues will improve  I certify that  inpatient services furnished can reasonably be expected to improve the patient's condition.    Sharma Covert, MD 6/10/20193:56 PM

## 2018-05-02 NOTE — Progress Notes (Signed)
Patient ID: Gary Harper, male   DOB: [birth year], 47 y.o.   MRN: [id number]   Report accepted from admitting nurse, Debbe BalesBrook, RN. Pt currently presents with an appropriate affect and anxious behavior. Pt supported emotionally and encouraged to express concerns and questions. Pt's safety ensured with 15 minute and environmental checks. Pt currently denies SI/HI and A/V hallucinations. Pt verbally agrees to seek staff if SI/HI or A/VH occurs and to consult with staff before acting on any harmful thoughts. Pt requests to take his Requip "a couple of hours" before bedtime as he takes this to decrease restless leg symptoms. Will continue POC.

## 2018-05-02 NOTE — Progress Notes (Signed)
Pt is asleep in bed.  During assessment, pt scored a 3 on CIWA.  Pt is friendly and alert.  Pt only wants to sleep at this time.  Pt denies SI, HI and AVH.  Pt contracts for safety.

## 2018-05-02 NOTE — BH Assessment (Signed)
[facility]  Per Juanetta BeetsJacqueline Norman, DO, this pt requires psychiatric hospitalization.  Berneice Heinrichina Tate, RN, [facility]; [facility] will be ready to receive pt at 16:00.  Pt presents under IVC initiated by EDP Kristine RoyalPeter Messick, MD, and [facility].  Pt's nurse, Morrie Sheldonshley, has been notified, and agrees to call report to [phone].  Pt is to be transported via [employer] examinerlaw enforcement.  This morning pt's wife, Ailene ArdsMartha Altic [phone]), called to check on pt's status.  At the time, this Clinical research associatewriter informed her that I needed to check pt's chart for Consent to Release Information to her.  After finding that pt had granted consent, I called her back at 13:52, notifying her of pt's disposition.   Doylene Canninghomas Kaven [place], [place] Behavioral Health Coordinator [phone]

## 2018-05-03 DIAGNOSIS — G2581 Restless legs syndrome: Secondary | ICD-10-CM

## 2018-05-03 DIAGNOSIS — T43212D Poisoning by selective serotonin and norepinephrine reuptake inhibitors, intentional self-harm, subsequent encounter: Secondary | ICD-10-CM

## 2018-05-03 DIAGNOSIS — Z635 Disruption of family by separation and divorce: Secondary | ICD-10-CM

## 2018-05-03 DIAGNOSIS — T1491XD Suicide attempt, subsequent encounter: Secondary | ICD-10-CM

## 2018-05-03 MED ORDER — VITAMIN B-1 100 MG PO TABS
100.0000 mg | ORAL_TABLET | Freq: Every day | ORAL | Status: DC
  Administered 2018-05-04 – 2018-05-05 (×2): 100 mg via ORAL
  Filled 2018-05-03 (×5): qty 1

## 2018-05-03 MED ORDER — HYDROXYZINE HCL 25 MG PO TABS
25.0000 mg | ORAL_TABLET | Freq: Four times a day (QID) | ORAL | Status: DC | PRN
  Administered 2018-05-04: 25 mg via ORAL
  Filled 2018-05-03: qty 1

## 2018-05-03 MED ORDER — BUSPIRONE HCL 10 MG PO TABS
10.0000 mg | ORAL_TABLET | Freq: Two times a day (BID) | ORAL | Status: DC
  Administered 2018-05-04 – 2018-05-05 (×3): 10 mg via ORAL
  Filled 2018-05-03 (×7): qty 1
  Filled 2018-05-03: qty 2

## 2018-05-03 MED ORDER — LOPERAMIDE HCL 2 MG PO CAPS: 2.0000 mg | ORAL_CAPSULE | ORAL | Status: DC | PRN

## 2018-05-03 MED ORDER — ONDANSETRON 4 MG PO TBDP
4.0000 mg | ORAL_TABLET | Freq: Four times a day (QID) | ORAL | Status: DC | PRN
  Administered 2018-05-04: 4 mg via ORAL
  Filled 2018-05-03: qty 1

## 2018-05-03 MED ORDER — LORAZEPAM 1 MG PO TABS: 1.0000 mg | ORAL_TABLET | Freq: Four times a day (QID) | ORAL | Status: DC | PRN

## 2018-05-03 MED ORDER — ADULT MULTIVITAMIN W/MINERALS CH
1.0000 | ORAL_TABLET | Freq: Every day | ORAL | Status: DC
  Administered 2018-05-03 – 2018-05-05 (×3): 1 via ORAL
  Filled 2018-05-03 (×5): qty 1

## 2018-05-03 NOTE — Progress Notes (Addendum)
[facility] MD Progress Note  05/03/2018 12:44 PM Gary Harper  MRN:  [id number] Subjective:  Patient reports he is feeling partially better compared to before admission. Denies suicidal ideations, denies medication side effects thus far . Objective : I have discussed case with treatment team and have met with patient. 47 year old married male, no children, self employed . Presented 6/9 following overdose on prescribed Trazodone. Explains he had been " caught by my wife watching pornography" which resulted in marital tension, argument, and impulsive overdose . He endorses history of daily drinking, usually about three beers on weekdays and 6 beers on weekends. Currently presents with some facial flushing, minimal distal tremors, stable vital signs. Describes some headache, denies any visual disturbances . States that his wife is angry with him and told him she wants separation, but states " I am coping better with it today". Denies current suicidal or self injurious ideations, and contracts for safety on unit at this time . Denies medication side effects.    Principal Problem: depression, S/P Overdose  Diagnosis:   Patient Active Problem List   Diagnosis Date Noted  . MDD (major depressive disorder), recurrent severe, without psychosis ([place]) [F33.2] 05/02/2018  . Intentional drug overdose ([place]) [T50.902A]   . Suicidal ideation [R45.851]    Total Time spent with patient: 20 minutes  Past Psychiatric History:   Past Medical History:  Past Medical History:  Diagnosis Date  . Anxiety   . PE (pulmonary thromboembolism) ([place])     Past Surgical History:  Procedure Laterality Date  . ANKLE RECONSTRUCTION    . BACK SURGERY     Family History: History reviewed. No pertinent family history. Family Psychiatric  History:  Social History:  Social History   Substance and Sexual Activity  Alcohol Use Yes     Social History   Substance and Sexual Activity  Drug Use Not Currently   Comment:  UDS not available at time of assessment    Social History   Socioeconomic History  . Marital status: Single    Spouse name: Not on file  . Number of children: Not on file  . Years of education: Not on file  . Highest education level: Not on file  Occupational History  . Not on file  Social Needs  . Financial resource strain: Not on file  . Food insecurity:    Worry: Not on file    Inability: Not on file  . Transportation needs:    Medical: Not on file    Non-medical: Not on file  Tobacco Use  . Smoking status: Never Smoker  . Smokeless tobacco: Never Used  Substance and Sexual Activity  . Alcohol use: Yes  . Drug use: Not Currently    Comment: UDS not available at time of assessment  . Sexual activity: Not Currently  Lifestyle  . Physical activity:    Days per week: Not on file    Minutes per session: Not on file  . Stress: Not on file  Relationships  . Social connections:    Talks on phone: Not on file    Gets together: Not on file    Attends religious service: Not on file    Active member of club or organization: Not on file    Attends meetings of clubs or organizations: Not on file    Relationship status: Not on file  Other Topics Concern  . Not on file  Social History Narrative  . Not on file   Additional  Social History:   Sleep: improving   Appetite:  improving   Current Medications: Current Facility-Administered Medications  Medication Dose Route Frequency Provider Last Rate Last Dose  . acamprosate (CAMPRAL) tablet 666 mg  666 mg Oral TID WC Sharma Covert, MD   666 mg at 05/03/18 1208  . acetaminophen (TYLENOL) tablet 650 mg  650 mg Oral Q6H PRN Ethelene Hal, NP      . alum & mag hydroxide-simeth (MAALOX/MYLANTA) 200-200-20 MG/5ML suspension 30 mL  30 mL Oral Q4H PRN Ethelene Hal, NP      . buPROPion (WELLBUTRIN XL) 24 hr tablet 150 mg  150 mg Oral Daily Sharma Covert, MD   150 mg at 05/03/18 3338  . busPIRone (BUSPAR)  tablet 15 mg  15 mg Oral TID Sharma Covert, MD   15 mg at 05/03/18 1209  . gabapentin (NEURONTIN) capsule 200 mg  200 mg Oral QHS Sharma Covert, MD   200 mg at 05/02/18 2142  . hydrOXYzine (ATARAX/VISTARIL) tablet 25 mg  25 mg Oral TID PRN Ethelene Hal, NP   25 mg at 05/02/18 2142  . magnesium hydroxide (MILK OF MAGNESIA) suspension 30 mL  30 mL Oral Daily PRN Ethelene Hal, NP      . rOPINIRole (REQUIP) tablet 3 mg  3 mg Oral QPM Ethelene Hal, NP   3 mg at 05/02/18 2143  . sertraline (ZOLOFT) tablet 200 mg  200 mg Oral Daily Sharma Covert, MD   200 mg at 05/03/18 3291    Lab Results:  Results for orders placed or performed during the hospital encounter of 05/01/18 (from the past 48 hour(s))  CBG monitoring, ED     Status: Abnormal   Collection Time: 05/01/18  2:08 PM  Result Value Ref Range   Glucose-Capillary 113 (H) 65 - 99 mg/dL  Urinalysis, Routine w reflex microscopic     Status: None   Collection Time: 05/01/18  2:18 PM  Result Value Ref Range   Color, Urine YELLOW YELLOW   APPearance CLEAR CLEAR   Specific Gravity, Urine 1.023 1.005 - 1.030   pH 6.0 5.0 - 8.0   Glucose, UA NEGATIVE NEGATIVE mg/dL   Hgb urine dipstick NEGATIVE NEGATIVE   Bilirubin Urine NEGATIVE NEGATIVE   Ketones, ur NEGATIVE NEGATIVE mg/dL   Protein, ur NEGATIVE NEGATIVE mg/dL   Nitrite NEGATIVE NEGATIVE   Leukocytes, UA NEGATIVE NEGATIVE    Comment: Performed at [facility], [place] [address], [place], [place] [address]  Urine rapid drug screen (hosp performed)     Status: None   Collection Time: 05/01/18  2:18 PM  Result Value Ref Range   Opiates NONE DETECTED NONE DETECTED   Cocaine NONE DETECTED NONE DETECTED   Benzodiazepines NONE DETECTED NONE DETECTED   Amphetamines NONE DETECTED NONE DETECTED   Tetrahydrocannabinol NONE DETECTED NONE DETECTED   Barbiturates NONE DETECTED NONE DETECTED    Comment: (NOTE) DRUG SCREEN FOR MEDICAL  PURPOSES ONLY.  IF CONFIRMATION IS NEEDED FOR ANY PURPOSE, NOTIFY LAB WITHIN 5 DAYS. LOWEST DETECTABLE LIMITS FOR URINE DRUG SCREEN Drug Class                     Cutoff (ng/mL) Amphetamine and metabolites    1000 Barbiturate and metabolites    200 Benzodiazepine                 600 Tricyclics and metabolites     300 Opiates  and metabolites        300 Cocaine and metabolites        300 THC                            50 Performed at [facility], [place] [address] [place], [place] [address]   Comprehensive metabolic panel     Status: Abnormal   Collection Time: 05/01/18  2:18 PM  Result Value Ref Range   Sodium 140 135 - 145 mmol/L   Potassium 4.2 3.5 - 5.1 mmol/L   Chloride 106 101 - 111 mmol/L   CO2 24 22 - 32 mmol/L   Glucose, Bld 113 (H) 65 - 99 mg/dL   BUN 13 6 - 20 mg/dL   Creatinine, Ser 1.00 0.61 - 1.24 mg/dL   Calcium 8.9 8.9 - 10.3 mg/dL   Total Protein 7.2 6.5 - 8.1 g/dL   Albumin 4.3 3.5 - 5.0 g/dL   AST 30 15 - 41 U/L   ALT 25 17 - 63 U/L   Alkaline Phosphatase 42 38 - 126 U/L   Total Bilirubin 0.4 0.3 - 1.2 mg/dL   GFR calc non Af Amer >60 >60 mL/min   GFR calc Af Amer >60 >60 mL/min    Comment: (NOTE) The eGFR has been calculated using the CKD EPI equation. This calculation has not been validated in all clinical situations. eGFR's persistently <60 mL/min signify possible Chronic Kidney Disease.    Anion gap 10 5 - 15    Comment: Performed at [facility], [place] [address], [place], [place] [address]  Ethanol     Status: None   Collection Time: 05/01/18  2:18 PM  Result Value Ref Range   Alcohol, Ethyl (B) <10 <10 mg/dL    Comment: (NOTE) Lowest detectable limit for serum alcohol is 10 mg/dL. For medical purposes only. Performed at [facility], [place] [address] [place], [place] [address]   Acetaminophen level     Status: Abnormal   Collection Time: 05/01/18  2:18 PM  Result Value Ref Range    Acetaminophen (Tylenol), Serum <10 (L) 10 - 30 ug/mL    Comment: (NOTE) Therapeutic concentrations vary significantly. A range of 10-30 ug/mL  may be an effective concentration for many patients. However, some  are best treated at concentrations outside of this range. Acetaminophen concentrations >150 ug/mL at 4 hours after ingestion  and >50 ug/mL at 12 hours after ingestion are often associated with  toxic reactions. Performed at [facility], [place] [address], [place], [place] [address]   Salicylate level     Status: None   Collection Time: 05/01/18  2:18 PM  Result Value Ref Range   Salicylate Lvl <4.9 2.8 - 30.0 mg/dL    Comment: Performed at [facility], [place] [address] [place], [place] [address]  CBC with Differential     Status: None   Collection Time: 05/01/18  2:18 PM  Result Value Ref Range   WBC 5.9 4.0 - 10.5 K/uL   RBC 4.40 4.22 - 5.81 MIL/uL   Hemoglobin 13.7 13.0 - 17.0 g/dL   HCT 40.4 39.0 - 52.0 %   MCV 91.8 78.0 - 100.0 fL   MCH 31.1 26.0 - 34.0 pg   MCHC 33.9 30.0 - 36.0 g/dL   RDW 13.0 11.5 - 15.5 %   Platelets 232 150 - 400 K/uL   Neutrophils Relative %  78 %   Neutro Abs 4.5 1.7 - 7.7 K/uL   Lymphocytes Relative 16 %   Lymphs Abs 1.0 0.7 - 4.0 K/uL   Monocytes Relative 6 %   Monocytes Absolute 0.4 0.1 - 1.0 K/uL   Eosinophils Relative 0 %   Eosinophils Absolute 0.0 0.0 - 0.7 K/uL   Basophils Relative 0 %   Basophils Absolute 0.0 0.0 - 0.1 K/uL    Comment: Performed at [facility], [place] [address], [place], [place] [address]    Blood Alcohol level:  Lab Results  Component Value Date   ETH <10 32/91/9166    Metabolic Disorder Labs: No results found for: HGBA1C, MPG No results found for: PROLACTIN No results found for: CHOL, TRIG, HDL, CHOLHDL, VLDL, LDLCALC  Physical Findings: AIMS: Facial and Oral Movements Muscles of Facial Expression: None, normal Lips and Perioral Area: None,  normal Jaw: None, normal Tongue: None, normal,Extremity Movements Upper (arms, wrists, hands, fingers): None, normal Lower (legs, knees, ankles, toes): None, normal, Trunk Movements Neck, shoulders, hips: None, normal, Overall Severity Severity of abnormal movements (highest score from questions above): None, normal Incapacitation due to abnormal movements: None, normal Patient's awareness of abnormal movements (rate only patient's report): No Awareness, Dental Status Current problems with teeth and/or dentures?: No Does patient usually wear dentures?: No  CIWA:    COWS:     Musculoskeletal: Strength & Muscle Tone: within normal limits subtle/minimal distal tremors, no diaphoresis, vitals stable  Gait & Station: normal Patient leans: N/A  Psychiatric Specialty Exam: Physical Exam  ROS reports headache, no visual disturbances, no chest pain, no shortness of breath  Blood pressure 124/79, pulse 84, temperature 98.9 F (37.2 C), temperature source Oral, resp. rate 18, height 6' 1"  (1.854 m), weight 94.8 kg (209 lb).Body mass index is 27.57 kg/m.  General Appearance: Well Groomed  Eye Contact:  Good  Speech:  Normal Rate  Volume:  Normal  Mood:  reports he is feeling better today  Affect:  vaguely anxious, constricted, does smile at times appropriately   Thought Process:  Linear and Descriptions of Associations: Intact  Orientation:  Other:  fully alert and attentive   Thought Content:  no hallucinations, no delusions, not internally preoccupied   Suicidal Thoughts:  No today denies suicidal or self injurious ideations, denies any homicidal or violent ideations and also  specifically denies any violent or homicidal ideations towards wife   Homicidal Thoughts:  No  Memory:  recent and remote grossly intact   Judgement:  Fair  Insight:  Fair  Psychomotor Activity:  no psychomotor agitation or restlessness   Concentration:  Concentration: Good and Attention Span: Good  Recall:  Good   Fund of Knowledge:  Good  Language:  Good  Akathisia:  Negative  Handed:  Right  AIMS (if indicated):     Assets:  Communication Skills Desire for Improvement Resilience  ADL's:  Intact  Cognition:  WNL  Sleep:  Number of Hours: 6.75   Assessment - 47 year old male, S/P impulsive suicide attempt by overdosing on Trazodone. Reports wife caught him viewing pornography resulting in marital argument and discord. States she has requested separation. Currently denies suicidal ideations and reports he is feeling better. Reports daily drinking, presents with some flushing /headache, but vitals stable. No history of severe WDL in the past . Treatment Plan Summary: Daily contact with patient to assess and evaluate symptoms and progress in treatment, Medication management, Plan inpatient treatment  and medications as below  Encourage group and milieu participation to work on coping skills and symptom reduction Will discontinue Wellbutrin XL for now to minimize risk of decreased seizure threshold in the context of possible alcohol WDL. Will consider restarting soon once risk of WDL is passed . Continue Zoloft 200 mgrs QDAY for depression, anxiety Continue Requip 3 mgrs QHS for restless leg symptoms Continue Campral 666 mgrs TID for alcohol use disorder  Continue  Buspar 10 mgrs BID  for anxiety  Continue Neurontin 200 mgrs QHS for anxiety Start Ativan PRN for potential alcohol WDL if needed  Treatment team working on disposition planning options  Recheck EKG to monitor QTc   Jenne Campus, MD 05/03/2018, 12:44 PM

## 2018-05-03 NOTE — Progress Notes (Signed)
Patient ID: Gary Harper, male   DOB: 1971/09/18, 47 y.o.   MRN: [id number]   D: Patient denies SI/HI and auditory and visual hallucinations. Patient reports tha he has some depression and anxiety and his affect is congruent. He reports he is sleeping well and has normal energy and concentration. 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 Tolerates medications.  A: Patient given emotional support from RN. Patient given medications per MD orders. Patient encouraged to attend groups and unit activities. Patient encouraged to come to staff with any questions or concerns.  R: Patient remains cooperative and appropriate. Will continue to monitor patient for safety.

## 2018-05-03 NOTE — [facility] Group Notes (Signed)
LCSW Group Therapy Note 05/03/2018 2:12 PM  Type of Therapy/Topic: Group Therapy: Feelings about Diagnosis  Participation Level: Active   Description of Group:  This group will allow patients to explore their thoughts and feelings about diagnoses they have received. Patients will be guided to explore their level of understanding and acceptance of these diagnoses. Facilitator will encourage patients to process their thoughts and feelings about the reactions of others to their diagnosis and will guide patients in identifying ways to discuss their diagnosis with significant others in their lives. This group will be process-oriented, with patients participating in exploration of their own experiences, giving and receiving support, and processing challenge from other group members.  Therapeutic Goals: 1. Patient will demonstrate understanding of diagnosis as evidenced by identifying two or more symptoms of the disorder 2. Patient will be able to express two feelings regarding the diagnosis 3. Patient will demonstrate their ability to communicate their needs through discussion and/or role play  Summary of Patient Progress:  Gary Harper was engaged and participated throughout the group session. Gary Harper states that he questioned "Why is this happening to me?" when he was diagnosed with a mental health diagnosis. Gary Harper reports that his mental illness has "destroyed" his relationship with his wife.     Therapeutic Modalities:  Cognitive Behavioral Therapy Brief Therapy Feelings Identification    Gary Harper Catalina AntiguaWilliams LCSWA Clinical Social Worker

## 2018-05-03 NOTE — [facility] Suicide Risk Assessment (Signed)
[facility] INPATIENT:  Family/Significant Other Suicide Prevention Education  Suicide Prevention Education:  Contact Attempts: Gary Harper, sister [phone]) has been identified by the patient as the family member/significant other with whom the patient will be residing, and identified as the person(s) who will aid the patient in the event of a mental health crisis.  With written consent from the patient, two attempts were made to provide suicide prevention education, prior to and/or following the patient's discharge.  We were unsuccessful in providing suicide prevention education.  A suicide education pamphlet was given to the patient to share with family/significant other.  Date and time of first attempt: 05/03/18 / 3:18pm   Gary Harper 05/03/2018, 3:18 PM

## 2018-05-03 NOTE — B[facility] Counselor (Signed)
Adult Comprehensive Assessment  Patient ID: Galen Manilahilip M Viscuso, male   DOB: [birth year], 47 y.o.   MRN: [id number]  Information Source: Information source: Patient  Current Stressors:  Patient states their primary concerns and needs for treatment are:: "I need additional help for AA and sex therapy" Patient states their goals for this hospitilization and ongoing recovery are:: "I would like to build my resources for the problems that I think I have" Educational / Learning stressors: Patient denies any stressors  Employment / Job issues: Curently employed at [employer] the [employer]. Patient reports the nature of his job is stressful.  Family Relationships: Patient reports he and his wife got into an argument a few days ago. He states she has asked for a divorce.  Financial / Lack of resources (include bankruptcy): Patient reports he and his wife are currently in debt. He states he does not know how to "figure it out"since his wife has requested a divorce.  Housing / Lack of housing: Patient reports he currently lives in the home he and his wife share in [place], [place], however he will search for an apartment at discharge.  Physical health (include injuries & life threatening diseases): Patient denies any stressors Social relationships: Patient denies any stressors  Substance abuse: Patient reports he drinks alcohol "socially". Denies any other substance abuse  Bereavement / Loss: Patient reports losing his marriage of 20 years is the only loss he has suffered recently  Living/Environment/Situation:  Living Arrangements: Spouse/significant other Living conditions (as described by patient or guardian): "Comfortable and safe" Who else lives in the home?: Wife  How long has patient lived in current situation?: 6 years  What is atmosphere in current home: Comfortable  Family History:  Marital status: Separated Separated, when?: "Since last night" What types of issues is patient dealing with  in the relationship?: Patient reports he is experiencing marital issues with his wife. 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  Additional relationship information: No Are you sexually active?: Yes What is your sexual orientation?: Heterosexual  Has your sexual activity been affected by drugs, alcohol, medication, or emotional stress?: No Does patient have children?: No  Childhood History:  By whom was/is the patient raised?: Mother Additional childhood history information: Patient reports due to his parents not being together, he and his father shared a "friendship" versus father and son relationship Description of patient's relationship with caregiver when they were a child: Patient reports having a great relationship with his mother and father during his childhood.  Patient's description of current relationship with people who raised him/her: Patient reports currently having a good relationship with both of his parents How were you disciplined when you got in trouble as a child/adolescent?: Punishment Does patient have siblings?: Yes Number of Siblings: 5 Description of patient's current relationship with siblings: Patient reports having a good relationship with her four sister, however he does not have a relationship with his only brother Did patient suffer any verbal/emotional/physical/sexual abuse as a child?: No Did patient suffer from severe childhood neglect?: No Has patient ever been sexually abused/assaulted/raped as an adolescent or adult?: No Was the patient ever a victim of a crime or a disaster?: No Witnessed domestic violence?: No Has patient been effected by domestic violence as an adult?: No  Education:  Highest grade of school patient has completed: Associate's degree Currently a student?: No Learning disability?: No  Employment/Work Situation:   Employment situation: Employed 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   Financial Resources:   Financial resources: Income from employment, Private insurance Does patient have a representative payee or guardian?: No  Alcohol/Substance Abuse:   What has been your use of drugs/alcohol within the last 12 months?: Patient reports he drinks alcohol "socially";  Denies any substance abuse issues  If attempted suicide, did drugs/alcohol play a role in this?: No Alcohol/Substance Abuse Treatment Hx: Denies past history Has alcohol/substance abuse ever caused legal problems?: No  Social Support System:   Patient's Community Support System: Good Describe Community Support System: "My sisters and my friends" Type of faith/religion: None How does patient's faith help to cope with current illness?: N/A   Leisure/Recreation:   Leisure and Hobbies: English as a second language teacher, fishing, volleyball and surfing"  Strengths/Needs:   What is the patient's perception of their strengths?: "I'm very good at problem solving, multi tasking and a good Production designer, theatre/television/film" Patient states they can use these personal strengths during their treatment to contribute to their recovery: Yes  Patient states these barriers may affect/interfere with their treatment: No Patient states these barriers may affect their return to the community: No Other important information patient would like considered in planning for their treatment:  No  Discharge Plan:   Currently receiving community mental health services: Yes (From Whom)(Dr. Cottle(med mgmt) and Fred May (therapist)) Patient states concerns and preferences for aftercare planning are: Continue to follow up with current outpatient providers Patient states they will know when they are safe and ready for discharge when: Yes Does patient have access to transportation?: Yes Does patient have financial barriers related to discharge medications?: No Will patient be returning to same living situation after discharge?: Yes  Summary/Recommendations:   Summary and Recommendations (to be completed by the evaluator): Aneta Mins is a 47 year old male who is presented to the hospital seeking treatment for depressive symtpoms and suicidal ideation. Aneta Mins reports that he came into the hospital after making an impulsive decision to overdose on medications after his wife requested a divorce. 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 Aneta Mins repors that at discharge he plans to follow up with Dr. Jennelle Human and his therapist at [facility] and he plans to return to his current home. Aneta Mins states that he plans to seek new housing at discharge. Aneta Mins can benefri crom crisis stabilization, medication management, therapeutic milieu and referral services.   Maeola Sarah. 05/03/2018

## 2018-05-04 MED ORDER — GABAPENTIN 300 MG PO CAPS
300.0000 mg | ORAL_CAPSULE | Freq: Every day | ORAL | Status: DC
  Administered 2018-05-04: 300 mg via ORAL
  Filled 2018-05-04 (×4): qty 1

## 2018-05-04 NOTE — [facility] Suicide Risk Assessment (Signed)
[facility] INPATIENT:  Family/Significant Other Suicide Prevention Education  Suicide Prevention Education:  Contact Attempts:Gary Harper, sister [phone])  has been identified by the patient as the family member/significant other with whom the patient will be residing, and identified as the person(s) who will aid the patient in the event of a mental health crisis.  With written consent from the patient, two attempts were made to provide suicide prevention education, prior to and/or following the patient's discharge.  We were unsuccessful in providing suicide prevention education.  A suicide education pamphlet was given to the patient to share with family/significant other.  Date and time of second attempt:05/04/18, 1503  Lorri FrederickWierda, Gary Meester Jon, LCSW 05/04/2018, 3:02 PM

## 2018-05-04 NOTE — Therapy (Signed)
Occupational Therapy Group Treatment Note  Date:  05/04/2018 Time:  2:42 PM  Group Topic/Focus:  Stress Management  Participation Level:  Active  Participation Quality:  Appropriate and Sharing  Affect:  Appropriate  Cognitive:  Appropriate  Insight: Improving  Engagement in Group:  Engaged  Modes of Intervention:  Activity, Discussion and Education  Additional Comments:    S: "I have to go for walks when my stress increases a lot"  O: Stress management group completed to use as productive coping strategy, to help mitigate maladaptive coping to integrate in functional BADL/IADL. Education given on the definition of stress and its cognitive, behavioral, emotional, and physical effects on the body. Stress symptom checklist completed. Stress management tool worksheet discussed to educate on unhealthy vs healthy coping skills to manage stress to improve community integration. Self control circle activity completed to identify areas of control and areas not within personal control. Education given on use of progressive muscle relaxation. PMR script delivered with relaxing music to facilitate relaxation response to help increase ability to engage in BADL. Sleep hygiene given in reference to routine and lifestyle changes to increase engagement in BADL when integrating into community.  A: Pt presents to group, with smiling affect with almost all conversation. Pt initiating conversation, offering examples, and relating to other group members. Pt completed stress symptom checklist, showing moderate signs of stress. Insight to stress situation is questioned. Pt engaged in stress management tools worksheet, offering examples and how to improve current practices. Stating he often suppresses emotions and knows this danger, and would like to practice positive self talk and acceptance to combat stress this date. Self control circle activity completed, pt recognizing she cannot control how others speak to  her- but he can control his reactions. Pt engaged in PMR script, with much enjoyment and significant relaxation response. He states he is eager to use this in common practice along with deep breathing.   P: Pt provided with education on stress management activities to implement into daily routine. Handouts given to facilitate carryover when reintegrating into community      [facility] LouisianaKaylee Ariana Juul, [place]MS[place], OTR/L  AvnetKaylee Heberto [place] 05/04/2018, 2:42 PM

## 2018-05-04 NOTE — Progress Notes (Signed)
Pt presents with a flat affect. Pt denies depression, anxiety and hopelessness. Pt denies SI/HI. Pt reports difficulty sleeping last night and requested for Neurontin to be increased. MD made aware. Pt c/o an ongoing headache. MD made aware and pt tx'd with tylenol. Pt expressed that he plans to continue seeking counseling and would like to attend local groups for additional treatment. Pt verbalized that his sister is coming into town from [place] to help him find an apt. Pt reports tolerating his meds well.  Medications reviewed with pt and education provided. V/s and labs reviewed.ent team completed. Verbal support provided. Self inventory sheet reviewed. Pt encouraged to attend scheduled groups. Abnormal v/s reassessed. 15 minute checks performed for safety.   Pt complaint with tx plan.   

## 2018-05-04 NOTE — Tx Team (Signed)
Interdisciplinary Treatment and Diagnostic Plan Update  05/04/2018 Time of Session: 1023 Gary Harper MRN: [id number]  Principal Diagnosis: <principal problem not specified>  Secondary Diagnoses: Active Problems:   MDD (major depressive disorder), recurrent severe, without psychosis (HCC)   Current Medications:  Current Facility-Administered Medications  Medication Dose Route Frequency Provider Last Rate Last Dose  . acamprosate (CAMPRAL) tablet 666 mg  666 mg Oral TID WC Antonieta Pert, MD   666 mg at 05/04/18 9562  . acetaminophen (TYLENOL) tablet 650 mg  650 mg Oral Q6H PRN Laveda Abbe, NP   650 mg at 05/04/18 0820  . alum & mag hydroxide-simeth (MAALOX/MYLANTA) 200-200-20 MG/5ML suspension 30 mL  30 mL Oral Q4H PRN Laveda Abbe, NP      . busPIRone (BUSPAR) tablet 10 mg  10 mg Oral BID Cobos, Rockey Situ, MD   10 mg at 05/04/18 0820  . gabapentin (NEURONTIN) capsule 200 mg  200 mg Oral QHS Antonieta Pert, MD   200 mg at 05/03/18 2103  . hydrOXYzine (ATARAX/VISTARIL) tablet 25 mg  25 mg Oral Q6H PRN Cobos, Rockey Situ, MD   25 mg at 05/04/18 0819  . loperamide (IMODIUM) capsule 2-4 mg  2-4 mg Oral PRN Cobos, Rockey Situ, MD      . LORazepam (ATIVAN) tablet 1 mg  1 mg Oral Q6H PRN Cobos, Fernando A, MD      . magnesium hydroxide (MILK OF MAGNESIA) suspension 30 mL  30 mL Oral Daily PRN Laveda Abbe, NP      . multivitamin with minerals tablet 1 tablet  1 tablet Oral Daily Cobos, Rockey Situ, MD   1 tablet at 05/04/18 0820  . ondansetron (ZOFRAN-ODT) disintegrating tablet 4 mg  4 mg Oral Q6H PRN Cobos, Rockey Situ, MD   4 mg at 05/04/18 0616  . rOPINIRole (REQUIP) tablet 3 mg  3 mg Oral QPM Laveda Abbe, NP   3 mg at 05/03/18 1654  . sertraline (ZOLOFT) tablet 200 mg  200 mg Oral Daily Antonieta Pert, MD   200 mg at 05/04/18 0820  . thiamine (VITAMIN B-1) tablet 100 mg  100 mg Oral Daily Cobos, Rockey Situ, MD   100 mg at 05/04/18 0820    PTA Medications: Medications Prior to Admission  Medication Sig Dispense Refill Last Dose  . busPIRone (BUSPAR) 15 MG tablet Take 15 mg by mouth daily.   0 Past Week at Unknown time  . LORazepam (ATIVAN) 1 MG tablet Take 1 mg by mouth daily as needed for anxiety or sleep.   0 Over a month ago  . rOPINIRole (REQUIP) 3 MG tablet Take 3 mg by mouth every evening.  1 Past Week at Unknown time  . sertraline (ZOLOFT) 100 MG tablet Take 200 mg by mouth daily.  0 04/30/2018 at Unknown time  . traZODone (DESYREL) 100 MG tablet Take 100 mg by mouth at bedtime.  1 04/30/2018 at Unknown time    Patient Stressors: Marital or family conflict  Patient Strengths: Ability for insight Average or above average intelligence Capable of independent living Communication skills General fund of knowledge Motivation for treatment/growth  Treatment Modalities: Medication Management, Group therapy, Case management,  1 to 1 session with clinician, Psychoeducation, Recreational therapy.   Physician Treatment Plan for Primary Diagnosis: <principal problem not specified> Long Term Goal(s): Improvement in symptoms so as ready for discharge Improvement in symptoms so as ready for discharge   Short Term Goals: Ability to identify  changes in lifestyle to reduce recurrence of condition will improve Ability to verbalize feelings will improve Ability to disclose and discuss suicidal ideas Ability to demonstrate self-control will improve Ability to identify and develop effective coping behaviors will improve Ability to maintain clinical measurements within normal limits will improve Compliance with prescribed medications will improve Ability to identify triggers associated with substance abuse/mental health issues will improve Ability to identify changes in lifestyle to reduce recurrence of condition will improve Ability to verbalize feelings will improve Ability to disclose and discuss suicidal ideas Ability to  demonstrate self-control will improve Ability to identify and develop effective coping behaviors will improve Ability to maintain clinical measurements within normal limits will improve Compliance with prescribed medications will improve Ability to identify triggers associated with substance abuse/mental health issues will improve  Medication Management: Evaluate patient's response, side effects, and tolerance of medication regimen.  Therapeutic Interventions: 1 to 1 sessions, Unit Group sessions and Medication administration.  Evaluation of Outcomes: Progressing  Physician Treatment Plan for Secondary Diagnosis: Active Problems:   MDD (major depressive disorder), recurrent severe, without psychosis (HCC)  Long Term Goal(s): Improvement in symptoms so as ready for discharge Improvement in symptoms so as ready for discharge   Short Term Goals: Ability to identify changes in lifestyle to reduce recurrence of condition will improve Ability to verbalize feelings will improve Ability to disclose and discuss suicidal ideas Ability to demonstrate self-control will improve Ability to identify and develop effective coping behaviors will improve Ability to maintain clinical measurements within normal limits will improve Compliance with prescribed medications will improve Ability to identify triggers associated with substance abuse/mental health issues will improve Ability to identify changes in lifestyle to reduce recurrence of condition will improve Ability to verbalize feelings will improve Ability to disclose and discuss suicidal ideas Ability to demonstrate self-control will improve Ability to identify and develop effective coping behaviors will improve Ability to maintain clinical measurements within normal limits will improve Compliance with prescribed medications will improve Ability to identify triggers associated with substance abuse/mental health issues will improve     Medication  Management: Evaluate patient's response, side effects, and tolerance of medication regimen.  Therapeutic Interventions: 1 to 1 sessions, Unit Group sessions and Medication administration.  Evaluation of Outcomes: Progressing   RN Treatment Plan for Primary Diagnosis: <principal problem not specified> Long Term Goal(s): Knowledge of disease and therapeutic regimen to maintain health will improve  Short Term Goals: Ability to identify and develop effective coping behaviors will improve and Compliance with prescribed medications will improve  Medication Management: RN will administer medications as ordered by provider, will assess and evaluate patient's response and provide education to patient for prescribed medication. RN will report any adverse and/or side effects to prescribing provider.  Therapeutic Interventions: 1 on 1 counseling sessions, Psychoeducation, Medication administration, Evaluate responses to treatment, Monitor vital signs and CBGs as ordered, Perform/monitor CIWA, COWS, AIMS and Fall Risk screenings as ordered, Perform wound care treatments as ordered.  Evaluation of Outcomes: Progressing   LCSW Treatment Plan for Primary Diagnosis: <principal problem not specified> Long Term Goal(s): Safe transition to appropriate next level of care at discharge, Engage patient in therapeutic group addressing interpersonal concerns.  Short Term Goals: Engage patient in aftercare planning with referrals and resources, Increase social support and Increase skills for wellness and recovery  Therapeutic Interventions: Assess for all discharge needs, 1 to 1 time with Social worker, Explore available resources and support systems, Assess for adequacy in community support network, Educate  family and significant other(s) on suicide prevention, Complete Psychosocial Assessment, Interpersonal group therapy.  Evaluation of Outcomes: Progressing   Progress in Treatment: Attending groups:  Yes. Participating in groups: Yes. Taking medication as prescribed: Yes. Toleration medication: Yes. Family/Significant other contact made: No, will contact:  sister Patient understands diagnosis: Yes. Discussing patient identified problems/goals with staff: Yes. Medical problems stabilized or resolved: Yes. Denies suicidal/homicidal ideation: Yes. Issues/concerns per patient self-inventory: No. Other: none  New problem(s) identified: No, Describe:  none  New Short Term/Long Term Goal(s):  Patient Goals:  "Have a positive attitude, work on myself."  Discharge Plan or Barriers:   Reason for Continuation of Hospitalization: Depression Medication stabilization  Estimated Length of Stay: 3-5 days Attendees: Patient:Gary Harper 05/04/2018   Physician: Dr Jama Flavorsobos, MD 05/04/2018   Nursing: Liborio NixonPatrice White, RN 05/04/2018   RN Care Manager: 05/04/2018   Social Worker: Daleen SquibbGreg Jiovanni Heeter, LCSW 05/04/2018   Recreational Therapist:  05/04/2018   Other:  05/04/2018   Other:  05/04/2018   Other: 05/04/2018         Scribe for Treatment Team: Lorri FrederickWierda, Breeona Waid Jon, LCSW 05/04/2018 11:34 AM

## 2018-05-04 NOTE — Progress Notes (Signed)
Pt requested to have Neurontin increased at bedtime for sleep. Writer notified Dr. Jama Flavorsobos.  Verbal order given for writer to increase Neurontin 200 mg to 300 mg at bedtime. Order completed.

## 2018-05-04 NOTE — Progress Notes (Signed)
D: Pt was in dayroom upon initial approach.  Pt presents with depressed affect and mood.  Pt reports his day was "pretty good" and his goal is to "keep the day going the way it's going."  He denies SI/HI and hallucinations.  He reports pain from headache of 5/10.  Pt has been visible in milieu interacting appropriately with staff and peers.    A: Introduced self to pt.  Actively listened to pt and provided support and encouragement.  Medication administered per order.  PRN medication administered for pain.  Q15 minute safety checks maintained.  R: Pt is compliant with medications.  He verbally contracts for safety and reports he will inform staff of needs and concerns.  Will continue to monitor and assess.

## 2018-05-04 NOTE — Progress Notes (Signed)
Recreation Therapy Notes  Date: 6.12.19 Time: 0930 Location: [address] Dayroom  Group Topic: Stress Management  Goal Area(s) Addresses:  Patient will verbalize importance of using healthy stress management.  Patient will identify positive emotions associated with healthy stress management.   Intervention: Stress Management  Activity :  Meditation.  LRT introduced the stress management technique of meditation.  LRT played a meditation on letting go of the past and focusing on right now.  Patiens were to listen and follow along as meditation played.  Education: Stress Management, Discharge Planning.   Education Outcome: Acknowledges edcuation/In group clarification offered/Needs additional education  Clinical Observations/Feedback: Pt did not attend group.     Trask Vosler, LRT/CTRS          Gilberta Peeters A 05/04/2018 12:28 PM 

## 2018-05-04 NOTE — Plan of Care (Signed)
  Problem: Safety: Goal: Periods of time without injury will increase Outcome: Progressing Note:  Pt has not harmed self or others.  He denies SI/HI and verbally contracts for safety.

## 2018-05-04 NOTE — [facility] Group Notes (Signed)
[facility] 05/04/2018 1:15pm  Type of Therapy: Mental [facility] [employer] Presentation  Participation Level: Active  Participation Quality: Attentive  Affect: Appropriate  Cognitive: Oriented  Insight: Developing/Improving  Engagement in Therapy: Engaged  Modes of Intervention: Discussion, Education and Socialization  Summary of Progress/Problems: Mental [employer] Association (MHA) Speaker came to talk about his personal journey with mental health. The pt processed ways by which to relate to the speaker. MHA speaker provided handouts and educational information pertaining to groups and services offered by [facility]. Pt was engaged in speaker's presentation and was receptive to resources provided.    Rona RavensHeather S Hollyann Pablo, LCSW 05/04/2018 2:04 PM

## 2018-05-04 NOTE — Progress Notes (Signed)
[facility] MD Progress Note  05/04/2018 12:56 PM MATAIO MELE  MRN:  [id number] Subjective: Patient reports he is feeling better today.  He denies suicidal ideations and currently presents future oriented.  He states he is happy that a supportive sibling is  traveling to [place] area to spend some time with him and also that his wife appear to be more conciliatory during a recent phone conversation.  States that she expressed interest in working on their relationship.  Objective : I have discussed case with treatment team and have met with patient. 47 year old married male, no children, self employed . Presented 6/9 following overdose on prescribed Trazodone. Explains he had been " caught by my wife watching pornography" which resulted in marital tension, argument, and impulsive overdose . He endorses history of daily drinking, usually about three beers on weekdays and 6 beers on weekends. At this time patient presents with an improved mood and range of affect.  He presents future oriented and states that his plan is to relocate to [facility] after discharge as his job has a position there for him.  Regarding ongoing outpatient treatment states that he prefers to commute to [place] as he already has an established therapist and psychiatrist in this area. 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 Behavior on unit in good control, pleasant and polite on approach. Denies medication side effects. 6/11 EKG reviewed- QTc 410.   Principal Problem: depression, S/P Overdose  Diagnosis:   Patient Active Problem List   Diagnosis Date Noted  . MDD (major depressive disorder), recurrent severe, without psychosis ([place]) [F33.2] 05/02/2018  . Intentional drug overdose ([place]) [T50.902A]   . Suicidal ideation [R45.851]    Total Time spent with patient: 20 minutes  Past Psychiatric  History:   Past Medical History:  Past Medical History:  Diagnosis Date  . Anxiety   . PE (pulmonary thromboembolism) ([place])     Past Surgical History:  Procedure Laterality Date  . ANKLE RECONSTRUCTION    . BACK SURGERY     Family History: History reviewed. No pertinent family history. Family Psychiatric  History:  Social History:  Social History   Substance and Sexual Activity  Alcohol Use Yes     Social History   Substance and Sexual Activity  Drug Use Not Currently   Comment: UDS not available at time of assessment    Social History   Socioeconomic History  . Marital status: Single    Spouse name: Not on file  . Number of children: Not on file  . Years of education: Not on file  . Highest education level: Not on file  Occupational History  . Not on file  Social Needs  . Financial resource strain: Not on file  . Food insecurity:    Worry: Not on file    Inability: Not on file  . Transportation needs:    Medical: Not on file    Non-medical: Not on file  Tobacco Use  . Smoking status: Never Smoker  . Smokeless tobacco: Never Used  Substance and Sexual Activity  . Alcohol use: Yes  . Drug use: Not Currently    Comment: UDS not available at time of assessment  . Sexual activity: Not Currently  Lifestyle  . Physical activity:    Days per week: Not on file    Minutes per session: Not on  file  . Stress: Not on file  Relationships  . Social connections:    Talks on phone: Not on file    Gets together: Not on file    Attends religious service: Not on file    Active member of club or organization: Not on file    Attends meetings of clubs or organizations: Not on file    Relationship status: Not on file  Other Topics Concern  . Not on file  Social History Narrative  . Not on file   Additional Social History:   Sleep: Good  Appetite:  Good  Current Medications: Current Facility-Administered Medications  Medication Dose Route Frequency Provider Last  Rate Last Dose  . acamprosate (CAMPRAL) tablet 666 mg  666 mg Oral TID WC Sharma Covert, MD   666 mg at 05/04/18 1203  . acetaminophen (TYLENOL) tablet 650 mg  650 mg Oral Q6H PRN Ethelene Hal, NP   650 mg at 05/04/18 0820  . alum & mag hydroxide-simeth (MAALOX/MYLANTA) 200-200-20 MG/5ML suspension 30 mL  30 mL Oral Q4H PRN Ethelene Hal, NP      . busPIRone (BUSPAR) tablet 10 mg  10 mg Oral BID Maxyne Derocher, Myer Peer, MD   10 mg at 05/04/18 0820  . gabapentin (NEURONTIN) capsule 200 mg  200 mg Oral QHS Sharma Covert, MD   200 mg at 05/03/18 2103  . hydrOXYzine (ATARAX/VISTARIL) tablet 25 mg  25 mg Oral Q6H PRN Naven Giambalvo, Myer Peer, MD   25 mg at 05/04/18 0819  . loperamide (IMODIUM) capsule 2-4 mg  2-4 mg Oral PRN Galaxy Borden, Myer Peer, MD      . LORazepam (ATIVAN) tablet 1 mg  1 mg Oral Q6H PRN Tremond Shimabukuro A, MD      . magnesium hydroxide (MILK OF MAGNESIA) suspension 30 mL  30 mL Oral Daily PRN Ethelene Hal, NP      . multivitamin with minerals tablet 1 tablet  1 tablet Oral Daily Travis Purk, Myer Peer, MD   1 tablet at 05/04/18 0820  . ondansetron (ZOFRAN-ODT) disintegrating tablet 4 mg  4 mg Oral Q6H PRN Katriel Cutsforth, Myer Peer, MD   4 mg at 05/04/18 0616  . rOPINIRole (REQUIP) tablet 3 mg  3 mg Oral QPM Ethelene Hal, NP   3 mg at 05/03/18 1654  . sertraline (ZOLOFT) tablet 200 mg  200 mg Oral Daily Sharma Covert, MD   200 mg at 05/04/18 0820  . thiamine (VITAMIN B-1) tablet 100 mg  100 mg Oral Daily Chrisotpher Rivero, Myer Peer, MD   100 mg at 05/04/18 0820    Lab Results:  No results found for this or any previous visit (from the past 8 hour(s)).  Blood Alcohol level:  Lab Results  Component Value Date   ETH <10 76/19/5093    Metabolic Disorder Labs: No results found for: HGBA1C, MPG No results found for: PROLACTIN No results found for: CHOL, TRIG, HDL, CHOLHDL, VLDL, LDLCALC  Physical Findings: AIMS: Facial and Oral Movements Muscles of Facial  Expression: None, normal Lips and Perioral Area: None, normal Jaw: None, normal Tongue: None, normal,Extremity Movements Upper (arms, wrists, hands, fingers): None, normal Lower (legs, knees, ankles, toes): None, normal, Trunk Movements Neck, shoulders, hips: None, normal, Overall Severity Severity of abnormal movements (highest score from questions above): None, normal Incapacitation due to abnormal movements: None, normal Patient's awareness of abnormal movements (rate only patient's report): No Awareness, Dental Status Current problems with teeth and/or dentures?: No Does  patient usually wear dentures?: No  CIWA:  CIWA-Ar Total: 0 COWS:     Musculoskeletal: Strength & Muscle Tone: within normal limits no tremors, no restlessness, no diaphoresis. Gait & Station: normal Patient leans: N/A  Psychiatric Specialty Exam: Physical Exam  ROS mild headache, no visual disturbances, no chest pain, no shortness of breath  Blood pressure (!) 143/83, pulse 73, temperature (!) 97.4 F (36.3 C), temperature source Oral, resp. rate 18, height 6' 1" (1.854 m), weight 94.8 kg (209 lb).Body mass index is 27.57 kg/m.  General Appearance: Well Groomed  Eye Contact:  Good  Speech:  Normal Rate  Volume:  Normal  Mood:  Improving mood  Affect:  Full Range  Thought Process:  Linear and Descriptions of Associations: Intact  Orientation:  Other:  fully alert and attentive   Thought Content:  no hallucinations, no delusions, not internally preoccupied   Suicidal Thoughts:  No today denies suicidal or self injurious ideations, denies any homicidal or violent ideations and also  specifically denies any violent or homicidal ideations towards wife   Homicidal Thoughts:  No  Memory:  recent and remote grossly intact   Judgement:  Other:  Improving  Insight:  Improving  Psychomotor Activity:  no psychomotor agitation or restlessness   Concentration:  Concentration: Good and Attention Span: Good  Recall:   Good  Fund of Knowledge:  Good  Language:  Good  Akathisia:  Negative  Handed:  Right  AIMS (if indicated):     Assets:  Communication Skills Desire for Improvement Resilience  ADL's:  Intact  Cognition:  WNL  Sleep:  Number of Hours: 6.75   Assessment -patient presents with improving mood and range of affect.  No suicidal ideations.  Future oriented.  Tolerating medications well thus far.  Currently not presenting with significant symptoms of withdrawal.   Treatment Plan Summary: Daily contact with patient to assess and evaluate symptoms and progress in treatment, Medication management, Plan inpatient treatment  and medications as below  Treatment plan reviewed as below today June 12 Continue Zoloft 200 mgrs QDAY for depression, anxiety Continue Requip 3 mgrs QHS for restless leg symptoms Continue Campral 666 mgrs TID for alcohol use disorder  Continue  Buspar 10 mgrs BID  for anxiety  Continue Neurontin 200 mgrs QHS for anxiety Start Ativan PRN for potential alcohol WDL if needed  Treatment team working on disposition planning options     Jenne Campus, MD 05/04/2018, 12:56 PM   Patient ID: Cyndie Chime, male   DOB: 1970-12-13, 47 y.o.   MRN: [id number]

## 2018-05-04 NOTE — Progress Notes (Signed)
CS[facility] to [facility] and was able to schedule follow up with Dr Jennelle Humancottle for 6/20.  [facility] reports Gillermo MurdochFred May's next appt opening is August 29.  He does have regular cancellations and can fit pt in an open slot.  CSW then spoke to pt regarding this.  Fred May came to visit pt last night and told him to call him upon discharge and he will fit pt into his schedule. Garner NashGregory Jaelee Laughter, MSW, LCSW Clinical Social Worker 05/04/2018 3:24 PM

## 2018-05-04 NOTE — [facility] Group Notes (Signed)
Group opened with brief discussion and psycho-social ed around grief and loss in relationships and in relation to self - identifying life patterns, circumstances, changes connected to losses. Established group norms and goals.  Group goal of establishing open and affirming space for members to process loss and experience with grief, normalize grief experience and provide psycho social education and grief support.. Group members engaged in facilitated dialog and support.  Engaged with Worden's four tasks of grief, identifying how these tasks show in their own experiences  Gary Harper was present throughout group.  He stated that his wife had informed him that she was seeking divorce.  Related that he was hoping to keep his positivity and get through the moment.  Engaged briefly with another member whose spouse had also recently requested divorce.  Related difficulty of thinking of future past the next few moments.  Received encouragement from group around helpful coping strategies.     [facility]  / [facility] Chaplain Burnis KingfisherMatthew Sahej Schrieber, MDiv, [facility]

## 2018-05-04 NOTE — Plan of Care (Signed)
  Problem: Activity: Goal: Interest or engagement in activities will improve Outcome: Progressing   Problem: Coping: Goal: Ability to verbalize frustrations and anger appropriately will improve Outcome: Progressing   

## 2018-05-05 ENCOUNTER — Encounter (HOSPITAL_COMMUNITY): Payer: Self-pay | Admitting: Behavioral Health

## 2018-05-05 DIAGNOSIS — F1010 Alcohol abuse, uncomplicated: Secondary | ICD-10-CM

## 2018-05-05 DIAGNOSIS — Z9149 Other personal history of psychological trauma, not elsewhere classified: Secondary | ICD-10-CM

## 2018-05-05 DIAGNOSIS — F411 Generalized anxiety disorder: Secondary | ICD-10-CM

## 2018-05-05 MED ORDER — THIAMINE HCL 100 MG PO TABS: 100.0000 mg | ORAL_TABLET | Freq: Every day | ORAL | 0 refills | Status: DC

## 2018-05-05 MED ORDER — BUSPIRONE HCL 10 MG PO TABS: 10.0000 mg | ORAL_TABLET | Freq: Two times a day (BID) | ORAL | 0 refills | Status: DC

## 2018-05-05 MED ORDER — HYDROXYZINE HCL 25 MG PO TABS: 25.0000 mg | ORAL_TABLET | Freq: Four times a day (QID) | ORAL | 0 refills | Status: DC | PRN

## 2018-05-05 MED ORDER — GABAPENTIN 300 MG PO CAPS: 300.0000 mg | ORAL_CAPSULE | Freq: Every day | ORAL | 0 refills | Status: DC

## 2018-05-05 MED ORDER — ACAMPROSATE CALCIUM 333 MG PO TBEC: 666.0000 mg | DELAYED_RELEASE_TABLET | Freq: Three times a day (TID) | ORAL | 0 refills | Status: DC

## 2018-05-05 NOTE — Progress Notes (Signed)
Pt reports he had a good day.  He denies SI/HI/AVH.  He voiced no needs or concerns.  He has been observed sitting in the dayroom most of the evening talking with other patients and watching TV.  He is aware that his Neurontin was increased for the night and hopes he will not need anything else for sleep.  Support and encouragement offered.  Discharge plans are in process.  Safety maintained with q15 minute checks.

## 2018-05-05 NOTE — [facility]HH Suicide Risk Assessment (Addendum)
[facility] Discharge Suicide Risk Assessment   Principal Problem: depression Discharge Diagnoses:  Patient Active Problem List   Diagnosis Date Noted  . MDD (major depressive disorder), recurrent severe, without psychosis ([facility]) [F33.2] 05/02/2018  . Intentional drug overdose ([facility]) [T50.902A]   . Suicidal ideation [R45.851]     Total Time spent with patient: 30 minutes  Musculoskeletal: Strength & Muscle Tone: within normal limits Gait & Station: normal Patient leans: N/A  Psychiatric Specialty Exam: ROS no headache, no chest pain, no shortness of breath, no vomiting, no diarrhea, no rash, no fever, no chills   Blood pressure 119/75, pulse 91, temperature 98.4 F (36.9 C), temperature source Oral, resp. rate 18, height 6\' 1"  (1.854 m), weight 94.8 kg (209 lb).Body mass index is 27.57 kg/m.  General Appearance: Well Groomed  Eye Contact::  Good  Speech:  Normal Rate409  Volume:  Normal  Mood:  improved, states he feels better and not depressed today  Affect:  Appropriate and more reactive   Thought Process:  Linear and Descriptions of Associations: Intact  Orientation:  Full (Time, Place, and Person)  Thought Content:  denies hallucinations, no delusions, not internally preoccupied   Suicidal Thoughts:  No denies suicidal or self injurious ideations, no homicidal or violent ideations   Homicidal Thoughts:  No  Memory:  recent and remote grossly intact   Judgement:  Other:  improving   Insight:  improving   Psychomotor Activity:  Normal  Concentration:  Good  Recall:  Good  Fund of Knowledge:Good  Language: Good  Akathisia:  Negative  Handed:  Right  AIMS (if indicated):     Assets:  Desire for Improvement Resilience  Sleep:  Number of Hours: 6.75  Cognition: WNL  ADL's:  Intact   Mental Status Per Nursing Assessment::   On Admission:  Suicidal ideation indicated by patient, Self-harm thoughts, Self-harm behaviors  Demographic Factors:  47 year old married male,  currently separated, no children, employed   Loss Factors: Recent marital discord, separation  Historical Factors: No history of prior psychiatric admissions, no history of suicide attempts, reports history of anxiety Reports increased alcohol use prior to admission- was drinking about 3 beers on weekdays and 6 beers on weekend days.  Risk Reduction Factors:   Employed, Positive social support and Positive coping skills or problem solving skills  Continued Clinical Symptoms:  Alert, attentive, well related, pleasant, mood improved , affect fuller in range, no thought disorder, no suicidal or self injurious ideations, no homicidal or violent ideations, no hallucinations, no delusions, not internally preoccupied, future oriented. Plans to relocate to [facility] area where he will be working , plans to commute to [place] to continue outpatient psychiatric care . States he has had good conversations with wife and is hopeful that they will be able to work on preserving their marriage, states " for now I am going to focus on myself ". States he is interested in starting [employer] .  Denies medication side effects. Behavior on unit in good control, pleasant on approach Cognitive Features That Contribute To Risk:  No gross cognitive deficits noted upon discharge. Is alert , attentive, and oriented x 3   Suicide Risk:  Mild:  Suicidal ideation of limited frequency, intensity, duration, and specificity.  There are no identifiable plans, no associated intent, mild dysphoria and related symptoms, good self-control (both objective and subjective assessment), few other risk factors, and identifiable protective factors, including available and accessible social support.  Follow-up Information  Group, [facility]. Go on 05/12/2018.   Specialty:  Behavioral Health Why:  Please attend your medication appt with Dr Jennelle Humanottle on Thursday, 05/12/18, at 10:15am. Please contact Merlyn AlbertFred May to schedule  your next therapy appt. Contact information: [address] [place] [address] [phone]           Plan Of Care/Follow-up recommendations:  Activity:  as tolerated  Diet:  regular Tests:  NA' Other:  See below Patient is expressing readiness for discharge and is leaving unit in good spirits. [place] [place]  Plans to follow up as above for ongoing psychiatric care. Plans to go to [facility] meetings and states he plans to work on abstinence from alcohol as well . Has an established PCP for medical issues as needed- Dr. Keene BreathGriffin Fernando A Cobos, MD 05/05/2018, 10:02 AM

## 2018-05-05 NOTE — Progress Notes (Signed)
  [facility] :  Will you be returning to the same living situation after discharge:  Yes,  with wife At discharge, do you have transportation home?: Yes,  sister Do you have the ability to pay for your medications: Yes,  BCBS  Release of information consent forms completed and in the chart;  Patient's signature needed at discharge.  Patient to Follow up at: Follow-up Information    Group, [facility]. Go on 05/12/2018.   Specialty:  Behavioral Health Why:  Please attend your medication appt with Dr Jennelle Humanottle on Thursday, 05/12/18, at 10:15am. Please contact Merlyn AlbertFred May to schedule your next therapy appt. Contact information: [address] [place] [address] [phone]           Next level of care provider has access to [facility] Link:no  Safety Planning and Suicide Prevention discussed: No. Attempts made  Have you used any form of tobacco in the last 30 days? (Cigarettes, Smokeless Tobacco, Cigars, and/or Pipes): No  Has patient been referred to the Quitline?: Patient refused referral  Patient has been referred for addiction treatment: N/A  Lorri FrederickWierda, Tionna Gigante Jon, LCSW 05/05/2018, 11:09 AM

## 2018-05-05 NOTE — Progress Notes (Signed)
Patient ID: Gary Harper, male   DOB: 1971-04-26, 47 y.o.   MRN: [id number] Patient was educated on all of his discharge instructions and verbalized understanding.  Pt left unit with all of his discharge instructions, prescriptions scripts and all personal belongings.  Transportation home was provided by his family who met him at the entrance to the unit.

## 2018-05-05 NOTE — Discharge Summary (Addendum)
Physician Discharge Summary Note  Patient:  Gary Harper is an 47 y.o., male MRN:  [id number] DOB:  08-05-1971 Patient phone:  [phone] (home)  Patient address:   [address] [place] [address],  Total Time spent with patient: 30 minutes  Date of Admission:  05/02/2018 Date of Discharge: 05/05/2018  Reason for Admission:  intentional overdose of approximately 20 tablets of trazodone 100 mg    Principal Problem: <principal problem not specified> Discharge Diagnoses: Patient Active Problem List   Diagnosis Date Noted  . MDD (major depressive disorder), recurrent severe, without psychosis (HCC) [F33.2] 05/02/2018  . Intentional drug overdose (HCC) [T50.902A]   . Suicidal ideation [R45.851]     Past Psychiatric History: Patient has been seen by an outpatient psychiatrist and therapist for several years.  He has been treated with multiple medications without great success.  Sertraline is been the best.  He has not been hospitalized previously.    Past Medical History:  Past Medical History:  Diagnosis Date  . Anxiety   . PE (pulmonary thromboembolism) (HCC)     Past Surgical History:  Procedure Laterality Date  . ANKLE RECONSTRUCTION    . BACK SURGERY     Family History: History reviewed. No pertinent family history. Family Psychiatric  History: Patient stated several of his siblings are alcoholics     Social History:  Social History   Substance and Sexual Activity  Alcohol Use Yes     Social History   Substance and Sexual Activity  Drug Use Not Currently   Comment: UDS not available at time of assessment    Social History   Socioeconomic History  . Marital status: Single    Spouse name: Not on file  . Number of children: Not on file  . Years of education: Not on file  . Highest education level: Not on file  Occupational History  . Not on file  Social Needs  . Financial resource strain: Not on file  . Food insecurity:    Worry:  Not on file    Inability: Not on file  . Transportation needs:    Medical: Not on file    Non-medical: Not on file  Tobacco Use  . Smoking status: Never Smoker  . Smokeless tobacco: Never Used  Substance and Sexual Activity  . Alcohol use: Yes  . Drug use: Not Currently    Comment: UDS not available at time of assessment  . Sexual activity: Not Currently  Lifestyle  . Physical activity:    Days per week: Not on file    Minutes per session: Not on file  . Stress: Not on file  Relationships  . Social connections:    Talks on phone: Not on file    Gets together: Not on file    Attends religious service: Not on file    Active member of club or organization: Not on file    Attends meetings of clubs or organizations: Not on file    Relationship status: Not on file  Other Topics Concern  . Not on file  Social History Narrative  . Not on file    Hospital Course:  Patient is seen and examined.  Patient is a 47 year old male with a known past psychiatric history significant for major depression, generalized anxiety disorder, posttraumatic stress disorder and recent disclosure of pornography addiction as well as alcohol use disorder.  He [facility] department on 05/01/2018 after an  intentional overdose of approximately 20 tablets of trazodone 100 mg.  The patient stated that he had had a long history with sonography addiction, but unfortunately last Thursday was the first time his wife had caught him watching podagra fee in their home.  She was very upset about this and left the home.  He felt significantly guilty about this and took the overdose of trazodone.  His wife returned the next morning and found him unable to walk.  He was taken to the emergency room.  He was monitored there.  Psychiatric consultation was obtained.  Once he was medically stabilized he was transferred to our facility for evaluation and stabilization.  The patient  admitted to a long-standing history of depression, anxiety and previous emotional trauma.  He is currently being treated by a local psychiatrist and therapist for several years.  They were unaware of the pornography addiction.  He has been treated with multiple antidepressants in the past, but none were successful except the sertraline.  He is currently on sertraline 200 mg p.o. daily.  He also takes BuSpar.  He had been taking trazodone for sleep.  He is also being treated with EMDR for his PTSD.  He admitted to helplessness, hopelessness and worthlessness.  He stated he was trying to kill himself.  His alcohol is been a issue with his wife, and they have been in counseling for some time.  He was supposed to have an assessment for alcohol dependence by someone in that same group this week.  He stated that last month he went 30 days without any alcohol without any withdrawal symptoms.  He stated that once he completed the 30 days he just went back to drinking.  He has a significant family history of alcohol dependence.  He was admitted to the hospital for evaluation and stabilization.   After the above admission assessment, patients presenting symptoms were identified. His UDS was negative. Ethanol showed no signs of toxicity.  He was medicated & discharged on; Zoloft 200 mgrs QDAY for depression, anxiety,  Requip 3 mgrs QHS for restless leg symptoms, Campral 666 mgrs TID for alcohol use disorder ,Buspar 10 mgrs BID  for anxiety and Neurontin 200 mgrs QHS for anxiety. He tolerated his treatment regimen without any adverse effects reported  During his hospital course, patient was enrolled & actively  participated in the group counseling sessions. AA/NA meetings were offered & held on this unit and patient actively particpated. He was able to verbalize coping skills that should help him cope better to maintain depression/mood stability upon returning home.  During the course of his hospitalization, patients  improvement was monitored by observation and his daily report of symptom reduction. Evidence was further noted by  presentation of good affect and improved mood & behavior. Upon discharge,he denied any SIHI, AVH, delusional thoughts or paranoia. He also denied any substance withdrawal symptoms. His case was presented during treatment team meeting this morning. The team members all agreed that Alaster was both mentally & medically stable to be discharged to continue mental health care on an outpatient basis as noted below. He was provided with all the necessary information needed to make this appointment without problems. He was provided with a  prescription for his [facility] discharge medications resume following discharge. He left [facility] with all personal belongings in no apparent distress. Transportation per his arrangement.  Physical Findings: AIMS: Facial and Oral Movements Muscles of Facial Expression: None, normal Lips and Perioral Area: None, normal Jaw: None, normal  Tongue: None, normal,Extremity Movements Upper (arms, wrists, hands, fingers): None, normal Lower (legs, knees, ankles, toes): None, normal, Trunk Movements Neck, shoulders, hips: None, normal, Overall Severity Severity of abnormal movements (highest score from questions above): None, normal Incapacitation due to abnormal movements: None, normal Patient's awareness of abnormal movements (rate only patient's report): No Awareness, Dental Status Current problems with teeth and/or dentures?: No Does patient usually wear dentures?: No  CIWA:  CIWA-Ar Total: 0 COWS:     Musculoskeletal: Strength & Muscle Tone: within normal limits Gait & Station: normal Patient leans: N/A  Psychiatric Specialty Exam: SEE SRA BY MD  Physical Exam  Nursing note and vitals reviewed. Constitutional: He is oriented to person, place, and time.  Neurological: He is alert and oriented to person, place, and time.    Review of Systems   Psychiatric/Behavioral: Positive for substance abuse. Negative for hallucinations, memory loss and suicidal ideas. Depression: improved. Nervous/anxious: improved. Insomnia: imrpvoed.   All other systems reviewed and are negative.   Blood pressure 119/75, pulse 91, temperature 98.4 F (36.9 C), temperature source Oral, resp. rate 18, height 6\' 1"  (1.854 m), weight 94.8 kg (209 lb).Body mass index is 27.57 kg/m.   Have you used any form of tobacco in the last 30 days? (Cigarettes, Smokeless Tobacco, Cigars, and/or Pipes): No  Has this patient used any form of tobacco in the last 30 days? (Cigarettes, Smokeless Tobacco, Cigars, and/or Pipes) N/A  Blood Alcohol level:  Lab Results  Component Value Date   ETH <10 05/01/2018    Metabolic Disorder Labs:  No results found for: HGBA1C, MPG No results found for: PROLACTIN No results found for: CHOL, TRIG, HDL, CHOLHDL, VLDL, LDLCALC  See Psychiatric Specialty Exam and Suicide Risk Assessment completed by Attending Physician prior to discharge.  Discharge destination:  Home  Is patient on multiple antipsychotic therapies at discharge:  No   Has Patient had three or more failed trials of antipsychotic monotherapy by history:  No  Recommended Plan for Multiple Antipsychotic Therapies: NA   Allergies as of 05/05/2018      Reactions   Sulfa Antibiotics Rash      Medication List    STOP taking these medications   rOPINIRole 3 MG tablet Commonly known as:  REQUIP   sertraline 100 MG tablet Commonly known as:  ZOLOFT   traZODone 100 MG tablet Commonly known as:  DESYREL     TAKE these medications     Indication  acamprosate 333 MG tablet Commonly known as:  CAMPRAL Take 2 tablets (666 mg total) by mouth 3 (three) times daily with meals.  Indication:  Excessive Use of Alcohol   busPIRone 10 MG tablet Commonly known as:  BUSPAR Take 1 tablet (10 mg total) by mouth 2 (two) times daily. What changed:    medication  strength  how much to take  when to take this  Indication:  Major Depressive Disorder   gabapentin 300 MG capsule Commonly known as:  NEURONTIN Take 1 capsule (300 mg total) by mouth at bedtime.  Indication:  insomnia   hydrOXYzine 25 MG tablet Commonly known as:  ATARAX/VISTARIL Take 1 tablet (25 mg total) by mouth every 6 (six) hours as needed (anxiety/agitation or CIWA < or = 10).  Indication:  Feeling Anxious   LORazepam 1 MG tablet Commonly known as:  ATIVAN Take 1 mg by mouth daily as needed for anxiety or sleep.  Indication:  anxiety   thiamine 100 MG tablet Take 1 tablet (100  mg total) by mouth daily. Start taking on:  05/06/2018  Indication:  deficiency/alcohol abuse      Follow-up Information    Group, [facility] Go on 05/12/2018.   Specialty:  Behavioral Health Why:  Please attend your medication appt with Dr Jennelle Human on Thursday, 05/12/18, at 10:15am. Please contact Merlyn Albert May to schedule your next therapy appt. Contact information: [address] [place] [address] [phone]           Follow-up recommendations: Follow up with your outpatient provided for any medical issues. Activity & diet as recommended by your primary care provider.  Comments:  Patient is instructed prior to discharge to: Take all medications as prescribed by his/her mental healthcare provider. Report any adverse effects and or reactions from the medicines to his/her outpatient provider promptly. Patient has been instructed & cautioned: To not engage in alcohol and or illegal drug use while on prescription medicines. In the event of worsening symptoms, patient is instructed to call the crisis hotline, 911 and or go to the nearest ED for appropriate evaluation and treatment of symptoms. To follow-up with his/her primary care provider for your other medical issues, concerns and or health care needs.  Signed: Denzil Magnuson, NP 05/05/2018, 10:36 AM    Patient seen, Suicide Assessment Completed.  Disposition Plan Reviewed

## 2018-08-24 ENCOUNTER — Ambulatory Visit (INDEPENDENT_AMBULATORY_CARE_PROVIDER_SITE_OTHER): Payer: Self-pay | Admitting: Psychiatry

## 2018-08-24 DIAGNOSIS — F411 Generalized anxiety disorder: Secondary | ICD-10-CM

## 2018-08-25 NOTE — Progress Notes (Signed)
      [facility] Counselor/Therapist Progress Note   Patient ID: Gary Harper, MRN: [id number]  Date: 08/25/2018  Timespent: 45 minutes  Treatment Type: Individual  Subjective: "2 weeks ago my wife called me and told me our relationship was over.  We were scheduled to meet with a marriage counselor.  She saw her individual counselor and said she could not get past the sexual addiction and the trust issue that it has caused.  I was sad but I managed well.  I did not act out in any way."  "I have anxiety about work.  I am far behind because it is only me doing the work.  I am worried that I Gary Harper have made the wrong decision".  I used EMDR with the client around his anxiety with work.  The client was able to reduce his subjective units of distress from 6+ to 0.  He then made a plan going forward of what he wants at his job and where he would like to live in the state.  Interventions:Solution Focused, Supportive and Other: EMDR, Boundaries  Mental Status Exam:   Appearance:   Casual     Behavior:  Appropriate  Motor:  Normal  Speech/Language:   Clear and Coherent  Affect:  Appropriate  Mood:  anxious  Thought process:  Coherent  Thought content:    Logical  Perceptual disturbances:    Normal  Orientation:  Full (Time, Place, and Person)  Attention:  Good  Concentration:  good  Memory:  Immediate  Fund of knowledge:   Good  Insight:    Good  Judgment:   Good  Impulse Control:  good    Reported Symptoms: Anxiety.  Risk Assessment: Danger to Self:  No Self-injurious Behavior: No Danger to Others: No Duty to Warn:no Physical Aggression / Violence:No  Access to Firearms a concern: No  Gang Involvement:No   Diagnosis:   ICD-10-CM   1. Generalized anxiety disorder F41.1      Plan: Make a list of jobs he would like to do. Also determine where he would like to live in the state.  Gary Harper, [place]

## 2018-08-31 ENCOUNTER — Other Ambulatory Visit: Payer: Self-pay

## 2018-08-31 MED ORDER — SERTRALINE HCL 100 MG PO TABS: 200.0000 mg | ORAL_TABLET | Freq: Every day | ORAL | 1 refills | Status: DC

## 2018-09-06 ENCOUNTER — Ambulatory Visit (INDEPENDENT_AMBULATORY_CARE_PROVIDER_SITE_OTHER): Payer: Self-pay | Admitting: Psychiatry

## 2018-09-06 DIAGNOSIS — F411 Generalized anxiety disorder: Secondary | ICD-10-CM

## 2018-09-06 NOTE — Progress Notes (Signed)
      [facility] Counselor/Therapist Progress Note   Patient ID: CARMAN ESSICK, MRN: [id number]  Date: 09/06/2018  Timespent: 50 minutes  Treatment Type: Individual  Subjective: The client reports that his wife called this past week.  "I do not have anyone to talk to".  Client reports that he did engage with her.  He then had been visiting a friend and drank too much and ended up staying on the couch.  His wife called him early in the morning worried that something was wrong since she could not get a hold of him.  She jumped to conclusions believing the worst about him.  We discussed the difficulty with his wife's thinking process  towards him.  She tends to interpret, mind read, fortune tell and then make decisions based on those conjectures.  It typically ends up crushing the client and communicating that she has no confidence in him.  Her expectations for him to meet her unspoken desires is patently unfair.  We discussed about how to set boundaries with her in a more assertive way.  The client agrees that with the way things are going, the back-and-forth is too dysfunctional.  He needs to have more space between them.  The client and his wife have been married for 19 years and they have been separated for 4 months.  We discussed a rule of thumb where the couple takes 1 month for each year of marriage to work on their marriage if they separate.  That gives the client 15 more months to work on things.  He knows things will not work now but he does not want to completely close the door.  I asked the client how he wants to set the boundaries and he felt that talking was going to be too difficult.  I suggested that the client and his wife email a letter to each other once a week.  In this letter detail how they are working on their issues and how their recovery is going.  It is also the place to ask questions about how the other person is doing.  The client thought this was a great idea and will try to  implement. Client's anxiety was at 8+ when he came in.  I used eye movement with the client to reduce it down to a 2+ at the end of the session.  Interventions:Solution Focused, Conservator, museum/gallery, Psychosocial Skills: Boundaries, Supportive and Other: EMDR  Mental Status Exam:   Appearance:   Casual     Behavior:  Appropriate  Motor:  Normal  Speech/Language:   Clear and Coherent  Affect:  Appropriate  Mood:  anxious  Thought process:  Coherent  Thought content:    Logical  Perceptual disturbances:    Normal  Orientation:  Full (Time, Place, and Person)  Attention:  Good  Concentration:  good  Memory:  Immediate  Fund of knowledge:   Good  Insight:    Good  Judgment:   Good  Impulse Control:  good    Reported Symptoms: Anxiety  Risk Assessment: Danger to Self:  No Self-injurious Behavior: No Danger to Others: No Duty to Warn:no Physical Aggression / Violence:No  Access to Firearms a concern: No  Gang Involvement:No   Diagnosis:   ICD-10-CM   1. Generalized anxiety disorder F41.1      Plan: Boundaries, self care, assertiveness, letters.  Gelene Mink Haddon Fyfe, [place]

## 2018-09-13 ENCOUNTER — Ambulatory Visit: Payer: Self-pay | Admitting: Psychiatry

## 2018-09-20 ENCOUNTER — Ambulatory Visit: Payer: Self-pay | Admitting: Psychiatry

## 2018-09-21 ENCOUNTER — Ambulatory Visit (INDEPENDENT_AMBULATORY_CARE_PROVIDER_SITE_OTHER): Payer: Self-pay | Admitting: Psychiatry

## 2018-09-21 DIAGNOSIS — F411 Generalized anxiety disorder: Secondary | ICD-10-CM

## 2018-09-22 NOTE — Progress Notes (Signed)
      [facility] Counselor/Therapist Progress Note   Patient ID: Gary Harper, MRN: [id number]  Date: 09/22/2018  Timespent: 45 minutes   Treatment Type: Individual   Reported Symptoms: Feelings of Worthlessness, Hopelessness   Mental Status Exam:    Appearance:   Casual     Behavior:  Appropriate  Motor:  Normal  Speech/Language:   Clear and Coherent  Affect:  Tearful  Mood:  anxious, depressed and sad  Thought process:  normal  Thought content:    WNL  Sensory/Perceptual disturbances:    WNL  Orientation:  oriented to person, place, time/date and situation  Attention:  Good  Concentration:  Good  Memory:  WNL  Fund of knowledge:   Good  Insight:    Good  Judgment:   Good  Impulse Control:  Good     Risk Assessment: Danger to Self:  No Self-injurious Behavior: No Danger to Others: No Duty to Warn:no Physical Aggression / Violence:No  Access to Firearms a concern: No  Gang Involvement:No    Subjective: The client states that he had followed the plan from last time.  He asked his wife if they could just write letters back and forth to each other which they did.  It worked well and then she wanted to meet.  He states that he ended up spending the weekend together and it was "wonderful".  Then on Tuesday after the weekend she texted him and said all this was too anxiety provoking and she could not do it.  The client was devastated once again and.  He states he thought about suicide but did not want to go through with that.  This is the third time that his wife has reunited with him and then dropped him.  We discussed today that he could not continue with this level of emotional back-and-forth.  He agreed.   Today we used eye movement desensitization and reprocessing to reduce his sense of desperation emptiness and loneliness.  It went from a subjective units of distress of 9 to less than 1 at the end of the session.  The client agreed that he would block his wife's  number so he would not get triggered once again. He would also write her a letter setting the appropriate boundaries that he needed from her.  We discussed him "shutting the door but not locking it."  The client still has hope that things Gary Harper eventually work out.   Interventions: Cognitive Behavioral Therapy, Assertiveness/Communication, Solution-Oriented/Positive Psychology, Eye Movement Desensitization and Reprocessing (EMDR), Insight-Oriented and Boundaries   Diagnosis:   ICD-10-CM   1. Generalized anxiety disorder F41.1      Plan: Block number, boundaries, social network.   Gary Harper, [place]

## 2018-10-12 ENCOUNTER — Encounter: Payer: Self-pay | Admitting: Emergency Medicine

## 2018-10-12 DIAGNOSIS — F411 Generalized anxiety disorder: Secondary | ICD-10-CM

## 2018-10-12 DIAGNOSIS — F4310 Post-traumatic stress disorder, unspecified: Secondary | ICD-10-CM

## 2018-10-12 DIAGNOSIS — F410 Panic disorder [episodic paroxysmal anxiety] without agoraphobia: Secondary | ICD-10-CM

## 2018-10-14 ENCOUNTER — Ambulatory Visit (INDEPENDENT_AMBULATORY_CARE_PROVIDER_SITE_OTHER): Payer: Self-pay | Admitting: Psychiatry

## 2018-10-14 ENCOUNTER — Encounter: Payer: Self-pay | Admitting: Psychiatry

## 2018-10-14 DIAGNOSIS — F411 Generalized anxiety disorder: Secondary | ICD-10-CM

## 2018-10-14 NOTE — Progress Notes (Signed)
      [facility] Counselor/Therapist Progress Note  Patient ID: Gary Harper, MRN: [id number],    Date: 10/14/2018  Time Spent: 58 minutes   Treatment Type: Individual Therapy  Reported Symptoms: Anxious Mood  Mental Status Exam:  Appearance:   Casual     Behavior:  Appropriate  Motor:  Normal  Speech/Language:   Clear and Coherent  Affect:  Appropriate  Mood:  anxious  Thought process:  normal  Thought content:    WNL  Sensory/Perceptual disturbances:    WNL  Orientation:  oriented to person, place, time/date and situation  Attention:  Good  Concentration:  Good  Memory:  WNL  Fund of knowledge:   Good  Insight:    Good  Judgment:   Good  Impulse Control:  Good   Risk Assessment: Danger to Self:  No Self-injurious Behavior: No Danger to Others: No Duty to Warn:no Physical Aggression / Violence:No  Access to Firearms a concern: No  Gang Involvement:No   Subjective: The client reports he felt much better after his last session here.  He states, "when I was walking out of the office my wife called me."  He went on to say that he laid out for her what his expectations are and that he will not put up with all of this back-and-forth.  His wife understands that and is now grappling with coming to terms with her family.  She does not want the toxicity in her life.  They have been together every day since his last session.  He states, "if she leaves again, I will be okay."  I reviewed with him how he can become suicidal and he stated that he is completely past that.  The client has done a good job in setting boundaries.  He will spend Thanksgiving with his family.  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  There is a lot of stress with that.  Today I used eye movement with the client to reduce his level of stress from a subjective units of distress of 7 to a subjective units of distress less than 1.  His positive cognition was, "I can do this."  Interventions:  Assertiveness/Communication, Solution-Oriented/Positive Psychology, Eye Movement Desensitization and Reprocessing (EMDR) and Insight-Oriented  Diagnosis:   ICD-10-CM   1. Generalized anxiety disorder F41.1     Plan: Boundaries, assertiveness.  Gelene MinkFrederick Saleen Peden, [place]

## 2018-10-25 ENCOUNTER — Ambulatory Visit: Payer: Self-pay | Admitting: Psychiatry

## 2018-10-25 ENCOUNTER — Encounter: Payer: Self-pay | Admitting: Psychiatry

## 2018-10-25 DIAGNOSIS — F411 Generalized anxiety disorder: Secondary | ICD-10-CM

## 2018-10-25 NOTE — Progress Notes (Signed)
      [facility] Counselor/Therapist Progress Note  Patient ID: Gary Harper, MRN: [id number],    Date: 10/25/2018   Time Spent: 46 minutes   Treatment Type: Individual Therapy  Reported Symptoms: Anxious Mood and Fatigue  Mental Status Exam:  Appearance:   Casual     Behavior:  Appropriate  Motor:  Normal  Speech/Language:   Clear and Coherent  Affect:  Appropriate  Mood:  anxious  Thought process:  normal  Thought content:    WNL  Sensory/Perceptual disturbances:    WNL  Orientation:  oriented to person, place, time/date and situation  Attention:  Good  Concentration:  Good  Memory:  WNL  Fund of knowledge:   Good  Insight:    Good  Judgment:   Good  Impulse Control:  Good   Risk Assessment: Danger to Self:  No Self-injurious Behavior: No Danger to Others: No Duty to Warn:no Physical Aggression / Violence:No  Access to Firearms a concern: No  Gang Involvement:No   Subjective: The client states that he and his wife are still together.  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  I used eye movement desensitization and reprocessing with the client focusing on the thought "house is going to work?"  His subjective units of distress was a 5 that he felt in his stomach.  As the client processed this he felt a huge pressure on his chest.  As he continued to see that decrease he said, "I need to revamp my list and make a critical timeline for things to be done."  The client has never thought that he was much of an organizer but I clarified with the client that he can organize he just does it at a looser fashion.  The client then realized that he can organize.  His positive cognition at the end of the session was "I have got this."  His subjective units of distress at the end was a 0.  Interventions: Solution-Oriented/Positive Psychology, Eye  Movement Desensitization and Reprocessing (EMDR) and Insight-Oriented  Diagnosis:   ICD-10-CM   1. Generalized anxiety disorder F41.1     Plan: Self care, boundaries, assertiveness.  Gary Harper, [place]

## 2018-10-27 ENCOUNTER — Encounter: Payer: Self-pay | Admitting: Psychiatry

## 2018-10-27 ENCOUNTER — Ambulatory Visit: Payer: Self-pay | Admitting: Psychiatry

## 2018-10-27 DIAGNOSIS — G2581 Restless legs syndrome: Secondary | ICD-10-CM

## 2018-10-27 DIAGNOSIS — F4001 Agoraphobia with panic disorder: Secondary | ICD-10-CM

## 2018-10-27 DIAGNOSIS — F411 Generalized anxiety disorder: Secondary | ICD-10-CM

## 2018-10-27 DIAGNOSIS — F331 Major depressive disorder, recurrent, moderate: Secondary | ICD-10-CM

## 2018-10-27 MED ORDER — BUSPIRONE HCL 15 MG PO TABS: 15.0000 mg | ORAL_TABLET | Freq: Two times a day (BID) | ORAL | 3 refills | Status: DC

## 2018-10-27 MED ORDER — ROPINIROLE HCL 3 MG PO TABS: 3.0000 mg | ORAL_TABLET | Freq: Every day | ORAL | 5 refills | Status: DC

## 2018-10-27 MED ORDER — SERTRALINE HCL 100 MG PO TABS: 200.0000 mg | ORAL_TABLET | Freq: Every day | ORAL | 1 refills | Status: DC

## 2018-10-27 MED ORDER — TRAZODONE HCL 100 MG PO TABS: 100.0000 mg | ORAL_TABLET | Freq: Every day | ORAL | 1 refills | Status: DC

## 2018-10-27 MED ORDER — LORAZEPAM 1 MG PO TABS: 1.0000 mg | ORAL_TABLET | Freq: Every day | ORAL | 2 refills | Status: DC | PRN

## 2018-10-27 NOTE — Progress Notes (Signed)
Gary Harper [id number] 11-Nov-1971 47 y.o.  Subjective:   Patient ID:  Gary Harper is a 47 y.o. (DOB 1971-10-21) male.  Chief Complaint:  Chief Complaint  Patient presents with  . Depression  . Anxiety    HPI Gary Harper presents to the office today for follow-up of above plus RLS. Stopped Aripiprazole bc doesn't feel he needs it . No changes off it.  Doesn't think he's bipolar nor needs it.  Therapy helps.  No new complaints.  Patient reports stable mood and denies depressed or irritable moods.  Patient denies any recent difficulty with anxiety usually.  Patient denies difficulty with sleep initiation or maintenance with meds and as long as doesn't overdo the caffeine. Denies appetite disturbance.  Patient reports that energy and motivation have been good.  Patient denies any difficulty with concentration.  Patient denies any suicidal ideation.  Working on marriage and making progress.  No problems with alcohol abuse.   Review of Systems:  Review of Systems  Neurological: Negative for tremors and weakness.       RLS controlled with meds  Psychiatric/Behavioral: Negative for agitation, behavioral problems, confusion, decreased concentration, dysphoric mood, hallucinations, self-injury, sleep disturbance and suicidal ideas. The patient is not nervous/anxious and is not hyperactive.     Medications: I have reviewed the patient's current medications.  Current Outpatient Medications  Medication Sig Dispense Refill  . busPIRone (BUSPAR) 15 MG tablet Take 15 mg by mouth 2 (two) times daily.    Marland Kitchen rOPINIRole (REQUIP) 3 MG tablet Take 3 mg by mouth at bedtime.    . sertraline (ZOLOFT) 100 MG tablet Take 2 tablets (200 mg total) by mouth daily. 180 tablet 1  . traZODone (DESYREL) 100 MG tablet Take 100 mg by mouth at bedtime.    . ARIPiprazole (ABILIFY) 10 MG tablet Take 10 mg by mouth daily. 1/2 tab daily    . hydrOXYzine (ATARAX/VISTARIL) 25 MG tablet Take 1 tablet (25 mg  total) by mouth every 6 (six) hours as needed (anxiety/agitation or CIWA < or = 10). (Patient not taking: Reported on 10/27/2018) 30 tablet 0  . LORazepam (ATIVAN) 1 MG tablet Take 1 mg by mouth daily as needed for anxiety or sleep.   0  . thiamine 100 MG tablet Take 1 tablet (100 mg total) by mouth daily. (Patient not taking: Reported on 10/27/2018) 30 tablet 0   No current facility-administered medications for this visit.     Medication Side Effects: None  Allergies:  Allergies  Allergen Reactions  . Sulfa Antibiotics Rash    Past Medical History:  Diagnosis Date  . Anxiety   . PE (pulmonary thromboembolism) (HCC)     History reviewed. No pertinent family history.  Social History   Socioeconomic History  . Marital status: Single    Spouse name: Not on file  . Number of children: Not on file  . Years of education: Not on file  . Highest education level: Not on file  Occupational History  . Not on file  Social Needs  . Financial resource strain: Not on file  . Food insecurity:    Worry: Not on file    Inability: Not on file  . Transportation needs:    Medical: Not on file    Non-medical: Not on file  Tobacco Use  . Smoking status: Never Smoker  . Smokeless tobacco: Never Used  Substance and Sexual Activity  . Alcohol use: Yes  . Drug use: Not Currently  Comment: UDS not available at time of assessment  . Sexual activity: Not Currently  Lifestyle  . Physical activity:    Days per week: Not on file    Minutes per session: Not on file  . Stress: Not on file  Relationships  . Social connections:    Talks on phone: Not on file    Gets together: Not on file    Attends religious service: Not on file    Active member of club or organization: Not on file    Attends meetings of clubs or organizations: Not on file    Relationship status: Not on file  . Intimate partner violence:    Fear of current or ex partner: Not on file    Emotionally abused: Not on file     Physically abused: Not on file    Forced sexual activity: Not on file  Other Topics Concern  . Not on file  Social History Narrative  . Not on file    Past Medical History, Surgical history, Social history, and Family history were reviewed and updated as appropriate.   Please see review of systems for further details on the patient's review from today.   Objective:   Physical Exam:  There were no vitals taken for this visit.  Physical Exam  Constitutional: He is oriented to person, place, and time. He appears well-developed. No distress.  Musculoskeletal: He exhibits no deformity.  Neurological: He is alert and oriented to person, place, and time. He displays no tremor. Coordination and gait normal.  Psychiatric: He has a normal mood and affect. His speech is normal and behavior is normal. Judgment and thought content normal. His mood appears not anxious. His affect is not angry, not blunt, not labile and not inappropriate. Cognition and memory are normal. He does not exhibit a depressed mood. He expresses no homicidal and no suicidal ideation. He expresses no suicidal plans and no homicidal plans.  Insight intact. Overall mood good. No auditory or visual hallucinations. No delusions.  He is attentive.    Lab Review:     Component Value Date/Time   NA 140 05/01/2018 1418   K 4.2 05/01/2018 1418   CL 106 05/01/2018 1418   CO2 24 05/01/2018 1418   GLUCOSE 113 (H) 05/01/2018 1418   BUN 13 05/01/2018 1418   CREATININE 1.00 05/01/2018 1418   CALCIUM 8.9 05/01/2018 1418   PROT 7.2 05/01/2018 1418   ALBUMIN 4.3 05/01/2018 1418   AST 30 05/01/2018 1418   ALT 25 05/01/2018 1418   ALKPHOS 42 05/01/2018 1418   BILITOT 0.4 05/01/2018 1418   GFRNONAA >60 05/01/2018 1418   GFRAA >60 05/01/2018 1418       Component Value Date/Time   WBC 5.9 05/01/2018 1418   RBC 4.40 05/01/2018 1418   HGB 13.7 05/01/2018 1418   HCT 40.4 05/01/2018 1418   PLT 232 05/01/2018 1418   MCV 91.8  05/01/2018 1418   MCH 31.1 05/01/2018 1418   MCHC 33.9 05/01/2018 1418   RDW 13.0 05/01/2018 1418   LYMPHSABS 1.0 05/01/2018 1418   MONOABS 0.4 05/01/2018 1418   EOSABS 0.0 05/01/2018 1418   BASOSABS 0.0 05/01/2018 1418    No results found for: POCLITH, LITHIUM   No results found for: PHENYTOIN, PHENOBARB, VALPROATE, CBMZ   .res Assessment: Plan:    Major depressive disorder, recurrent episode, moderate (HCC)  Panic disorder with agoraphobia  Generalized anxiety disorder  Restless legs syndrome  Therapy and meds helping.  No  evidence of bipolar so far off the Abilify. RLS controlled.  No changes indicated today and he agrees.  Disc SE each.  FU 4 mos.  Meredith Staggersarey Cottle, MD, DFAPA  Please see After Visit Summary for patient specific instructions.  Future Appointments  Date Time Provider Department Center  11/10/2018  3:00 PM [place], [place], [place] [facility] None  01/06/2019  4:00 PM [place], [place], [place] None  01/20/2019  4:00 PM [place], [place], [place] [facility] None  01/27/2019  4:00 PM [place], [place], [place] None  02/10/2019  4:00 PM [facility] [place] None    No orders of the defined types were placed in this encounter.     -------------------------------

## 2018-11-01 ENCOUNTER — Ambulatory Visit: Payer: Self-pay | Admitting: Psychiatry

## 2018-11-10 ENCOUNTER — Encounter: Payer: Self-pay | Admitting: Psychiatry

## 2018-11-10 ENCOUNTER — Ambulatory Visit: Payer: Self-pay | Admitting: Psychiatry

## 2018-11-10 DIAGNOSIS — F411 Generalized anxiety disorder: Secondary | ICD-10-CM

## 2018-11-10 NOTE — Progress Notes (Signed)
      [facility] Counselor/Therapist Progress Note  Patient ID: Gary Harper, MRN: [id number],    Date: 11/10/2018  Time Spent: 30 minutes   Treatment Type: Individual Therapy  Reported Symptoms: Anxious Mood  Mental Status Exam:  Appearance:   Casual     Behavior:  Appropriate  Motor:  Normal  Speech/Language:   Clear and Coherent  Affect:  Appropriate  Mood:  anxious  Thought process:  normal  Thought content:    WNL  Sensory/Perceptual disturbances:    WNL  Orientation:  oriented to person, place, time/date and situation  Attention:  Good  Concentration:  Good  Memory:  WNL  Fund of knowledge:   Good  Insight:    Good  Judgment:   Good  Impulse Control:  Good   Risk Assessment: Danger to Self:  No Self-injurious Behavior: No Danger to Others: No Duty to Warn:no Physical Aggression / Violence:No  Access to Firearms a concern: No  Gang Involvement:No   Subjective: The client was late for his appointment [place].  He states he and his wife are still together.  He has a lot on his plate because he is starting a new office in [place] as well as moving into a new apartment.  His stress is at subjective units of distress at 5 around work money in his relationship.  He states, I am "super multitasking." I used EMDR with the client to reduce his stress from a 5 to a 0 at the end of the session.  The client has a good handle on his relationship with his wife.  He will be spending time with his family over Christmas but he and his wife will be getting together.  He has a very positive outlook.  Interventions: Solution-Oriented/Positive Psychology, Eye Movement Desensitization and Reprocessing (EMDR) and Insight-Oriented  Diagnosis:   ICD-10-CM   1. Generalized anxiety disorder F41.1     Plan: Boundaries, assertiveness, self-care.  Gary [place], [place]

## 2018-11-22 ENCOUNTER — Ambulatory Visit: Payer: Self-pay | Admitting: Psychiatry

## 2018-11-30 ENCOUNTER — Encounter: Payer: Self-pay | Admitting: Psychiatry

## 2018-11-30 ENCOUNTER — Ambulatory Visit: Payer: Self-pay | Admitting: Psychiatry

## 2018-11-30 DIAGNOSIS — F411 Generalized anxiety disorder: Secondary | ICD-10-CM

## 2018-11-30 NOTE — Progress Notes (Signed)
      [facility] Counselor/Therapist Progress Note  Patient ID: HARTLEY HISLE, MRN: [id number],     Date: 11/30/2018  Time Spent: 52 minutes   Treatment Type: Individual Therapy  Reported Symptoms: Anxious Mood  Mental Status Exam:  Appearance:   Casual and Well Groomed     Behavior:  Appropriate  Motor:  Normal  Speech/Language:   Clear and Coherent  Affect:  Appropriate  Mood:  anxious and irritable  Thought process:  normal  Thought content:    WNL  Sensory/Perceptual disturbances:    WNL  Orientation:  oriented to person, place, time/date and situation  Attention:  Good  Concentration:  Good  Memory:  WNL  Fund of knowledge:   Good  Insight:    Good  Judgment:   Good  Impulse Control:  Good   Risk Assessment: Danger to Self:  No Self-injurious Behavior: No Danger to Others: No Duty to Warn:no Physical Aggression / Violence:No  Access to Firearms a concern: No  Gang Involvement:No   Subjective: The client reports that he and his wife are still together.  She is sent out a text to her family saying that they were back together.  "It has been rocky for the past few days." The client talked at length about his relationship with his wife and the great improvement that he sees.  Since his wife has told her family they have been putting pressure on her to leave.  They keep acting and controlling in condescending ways.  The client states this makes him very angry.  I used EMDR with the client focusing on his anger with his wife's family.  His negative thought was, "I am powerless."  He felt stress and anger in his chest.  His subjective units of distress was 4.  As the client processed he realized there was nothing he could do about her family.  He knew the work that he had done and how much better his relationship was with his wife.  In terms of addictive behavior he has not looked at any pornography or acted out with masturbation.  The client has had of beer sporadically  here and there.  He agrees that his behavior was probably based more out of the pain and shame that he felt that he was looking to self medicate.  As the client processed through the rest of this, his subjective units of distress was a 0.  His positive cognition was, "I can manage myself."  Interventions: Assertiveness/Communication, Solution-Oriented/Positive Psychology, Eye Movement Desensitization and Reprocessing (EMDR) and Insight-Oriented  Diagnosis:   ICD-10-CM   1. Generalized anxiety disorder F41.1     Plan: Self-care, sleep hygiene, exercise, boundaries, assertiveness  Tammi Sou, [place]

## 2018-12-06 ENCOUNTER — Encounter: Payer: Self-pay | Admitting: Psychiatry

## 2018-12-06 ENCOUNTER — Ambulatory Visit: Payer: Self-pay | Admitting: Psychiatry

## 2018-12-06 DIAGNOSIS — F411 Generalized anxiety disorder: Secondary | ICD-10-CM

## 2018-12-06 NOTE — Progress Notes (Signed)
      [facility] Counselor/Therapist Progress Note  Patient ID: Gary Harper, MRN: [id number],    Date: 12/06/2018  Time Spent: 52 minutes   Treatment Type: Individual Therapy  Reported Symptoms: Anxious Mood  Mental Status Exam:  Appearance:   Casual and Well Groomed     Behavior:  Appropriate  Motor:  Normal  Speech/Language:   Clear and Coherent  Affect:  Appropriate  Mood:  anxious  Thought process:  normal  Thought content:    WNL  Sensory/Perceptual disturbances:    WNL  Orientation:  oriented to person, place, time/date and situation  Attention:  Good  Concentration:  Good  Memory:  WNL  Fund of knowledge:   Good  Insight:    Good  Judgment:   Good  Impulse Control:  Good   Risk Assessment: Danger to Self:  No Self-injurious Behavior: No Danger to Others: No Duty to Warn:no Physical Aggression / Violence:No  Access to Firearms a concern: No  Gang Involvement:No   Subjective: Today the client brought in his wife for part of his appointment.  They have recently been separated and she had some concerns and fears concerning the client.  She had reviewed the last year and had wanted some questions answered.  How is he doing with his addiction?  Can I trust him?  How can I be sure that he will become who he was?  The client was very articulate and how much change he has made.  He notes that with the EMDR he has significantly changed his beliefs about himself.  He realized that his addictive behavior was really self-medicating the pain that he felt inside from his low self-esteem.  The separation in his hospitalization has been a big reset for the client.  This is one reason he will not become who he was because he has dealt with the underlying issues that led to his uncertainty and fear.  I advised the client to make sure that he avoided the codependence that tends to appear in his relationship with his wife.  That they needed to be independent of each other and not  enmeshed.  He also needed to give her space to be able to pull back if she needed to until she could learn to trust him again.  I also explained that trust will take time and that love is a risk.  Interventions: Assertiveness/Communication, Solution-Oriented/Positive Psychology and Insight-Oriented  Diagnosis:   ICD-10-CM   1. Generalized anxiety disorder F41.1     Plan: Assertiveness, boundaries, self-care.  Gelene Mink Aleesha Ringstad, [place]

## 2019-01-06 ENCOUNTER — Ambulatory Visit (INDEPENDENT_AMBULATORY_CARE_PROVIDER_SITE_OTHER): Payer: Self-pay | Admitting: Psychiatry

## 2019-01-06 ENCOUNTER — Encounter: Payer: Self-pay | Admitting: Psychiatry

## 2019-01-06 DIAGNOSIS — F4323 Adjustment disorder with mixed anxiety and depressed mood: Secondary | ICD-10-CM

## 2019-01-06 NOTE — Progress Notes (Signed)
      [facility] Counselor/Therapist Progress Note  Patient ID: Gary Harper, MRN: [id number],    Date: 01/06/2019  Time Spent: 45 minutes   Treatment Type: Individual Therapy  Reported Symptoms: Depressed mood and Anxious Mood  Mental Status Exam:  Appearance:   Casual     Behavior:  Appropriate  Motor:  Normal  Speech/Language:   Clear and Coherent  Affect:  Appropriate  Mood:  anxious and sad  Thought process:  normal  Thought content:    WNL  Sensory/Perceptual disturbances:    WNL  Orientation:  oriented to person, place, time/date and situation  Attention:  Good  Concentration:  Good  Memory:  WNL  Fund of knowledge:   Good  Insight:    Good  Judgment:   Good  Impulse Control:  Good   Risk Assessment: Danger to Self:  No Self-injurious Behavior: No Danger to Others: No Duty to Warn:no Physical Aggression / Violence:No  Access to Firearms a concern: No  Gang Involvement:No   Subjective: The client states that he received a text this morning that his wife no longer wants to see him.  She asked him to come by this afternoon and pick up his stuff in her apartment.  This is been a repeating pattern between the client and his wife.  She becomes very anxious and uncertain especially after she sees her therapist on Wednesdays.  She can be very confident about their relationship and then begins to second-guess things depending on what other people tell her.  This is the first time though the client says that she is ever asked him to come pick up his stuff.  He had already plan to give her some Valentine's Day gifts.  He will go ahead and do that but write a letter stating that he does still love her but is going to respect her wishes.  He will also leave other notes around the house that document important milestones in their 22-year relationship. I used EMDR with the client to reduce his flatness and anxiety.  As he processed he became more energized and hopeful about his  marriage.  He knows that if he can be persistent and not reactive that things will again come around.  His subjective units of distress at the end of the session was less than 1.  His positive cognition was, "I know this is possible."  Interventions: Assertiveness/Communication, Solution-Oriented/Positive Psychology, Eye Movement Desensitization and Reprocessing (EMDR) and Insight-Oriented  Diagnosis:   ICD-10-CM   1. Adjustment disorder with mixed anxiety and depressed mood F43.23     Plan: Mood independent behavior, self-care, positive self talk, exercise, trip to the beach.  Gelene Mink Tanaisha Pittman, [place]

## 2019-01-09 ENCOUNTER — Telehealth: Payer: Self-pay | Admitting: Psychiatry

## 2019-01-09 NOTE — Telephone Encounter (Signed)
Clarify wanting in [facility]

## 2019-01-09 NOTE — Telephone Encounter (Signed)
Patient called and said that he needs a new script of ativan 1 mg escribed to the [facility] on [address] in [place].

## 2019-01-10 NOTE — Telephone Encounter (Signed)
Left voicemail to clarify correct pharmacy, unable to locate

## 2019-01-11 ENCOUNTER — Other Ambulatory Visit: Payer: Self-pay

## 2019-01-11 MED ORDER — LORAZEPAM 1 MG PO TABS: 1.0000 mg | ORAL_TABLET | Freq: Every day | ORAL | 0 refills | Status: DC | PRN

## 2019-01-11 NOTE — Telephone Encounter (Signed)
Pt called back with correct pharmacy information. [facility] [address] [place] [address] [phone]

## 2019-01-11 NOTE — Progress Notes (Signed)
Pt called for refill for lorazepam 1mg  prn, rx called into [facility] in [place] Last fill 06/02/2018 #30  NEXT OFFICE VISIT 02/27/2019

## 2019-01-12 ENCOUNTER — Encounter: Payer: Self-pay | Admitting: Psychiatry

## 2019-01-12 ENCOUNTER — Ambulatory Visit: Payer: Self-pay | Admitting: Psychiatry

## 2019-01-12 DIAGNOSIS — F4323 Adjustment disorder with mixed anxiety and depressed mood: Secondary | ICD-10-CM

## 2019-01-12 NOTE — Progress Notes (Signed)
      [facility] Counselor/Therapist Progress Note  Patient ID: Gary Harper, MRN: [id number],    Date: 01/12/2019  Time Spent: 51 minutes   Treatment Type: Individual Therapy  Reported Symptoms: Sadness, tearful, anxious.  Mental Status Exam:  Appearance:   Casual     Behavior:  Appropriate  Motor:  Normal  Speech/Language:   Clear and Coherent  Affect:  Appropriate  Mood:  anxious, sad and tearful  Thought process:  normal  Thought content:    WNL  Sensory/Perceptual disturbances:    WNL  Orientation:  oriented to person, place, time/date and situation  Attention:  Good  Concentration:  Good  Memory:  WNL  Fund of knowledge:   Good  Insight:    Good  Judgment:   Good  Impulse Control:  Good   Risk Assessment: Danger to Self:  No Self-injurious Behavior: No Danger to Others: No Duty to Warn:no Physical Aggression / Violence:No  Access to Firearms a concern: No  Gang Involvement:No   Subjective: The client states that he did go over to his wife's apartment on Valentine's Day.  "I left notes all over the house telling her how great we have been together."  He picked up a birthday gift that she had made for him.  There was a stone inside that said "you are great."  The note said this is just to know how much I love you.  "Why would she do that?"  He states the next day he started having significant anxiety.  He then found out she had blocked his phone calls and text messages.  He said he found this the most hurtful thing.  Today I used EMDR with the client to begin to process through the sadness and tearfulness that he feels.  As he processed I pointed out to the client that this scenario has happened 3 or 4 times with him and his wife.  Today we made a plan that in 4 weeks he would try to make contact.  He would also leave her note that on Tuesday nights for example he would be at [employer] if she wanted to stop by.  That it was a favorite place for them to go  together.  Client is convinced that other people are influencing her decision concerning him.  As the client process we talked about him having patience and acceptance with where things are.  He plans to focus on taking care of himself right now no matter what is going on.  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  Interventions: Assertiveness/Communication, Solution-Oriented/Positive Psychology, Eye Movement Desensitization and Reprocessing (EMDR) and Insight-Oriented  Diagnosis:   ICD-10-CM   1. Adjustment disorder with mixed anxiety and depressed mood F43.23     Plan: Fishing group, exercise, self-care, positive self talk.  Gary Harper, [place]

## 2019-01-20 ENCOUNTER — Ambulatory Visit: Payer: Self-pay | Admitting: Psychiatry

## 2019-01-27 ENCOUNTER — Encounter: Payer: Self-pay | Admitting: Psychiatry

## 2019-01-27 ENCOUNTER — Ambulatory Visit: Payer: Self-pay | Admitting: Psychiatry

## 2019-01-27 DIAGNOSIS — F4323 Adjustment disorder with mixed anxiety and depressed mood: Secondary | ICD-10-CM
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
## 2019-02-10 ENCOUNTER — Ambulatory Visit: Payer: Self-pay | Admitting: Psychiatry

## 2019-02-27 ENCOUNTER — Ambulatory Visit (INDEPENDENT_AMBULATORY_CARE_PROVIDER_SITE_OTHER): Payer: Self-pay | Admitting: Psychiatry

## 2019-02-27 ENCOUNTER — Encounter: Payer: Self-pay | Admitting: Psychiatry

## 2019-02-27 DIAGNOSIS — F411 Generalized anxiety disorder: Secondary | ICD-10-CM

## 2019-02-27 DIAGNOSIS — G2581 Restless legs syndrome: Secondary | ICD-10-CM

## 2019-02-27 DIAGNOSIS — F4323 Adjustment disorder with mixed anxiety and depressed mood: Secondary | ICD-10-CM

## 2019-02-27 DIAGNOSIS — F4001 Agoraphobia with panic disorder: Secondary | ICD-10-CM

## 2019-02-27 DIAGNOSIS — F331 Major depressive disorder, recurrent, moderate: Secondary | ICD-10-CM
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
## 2019-03-17 ENCOUNTER — Ambulatory Visit (INDEPENDENT_AMBULATORY_CARE_PROVIDER_SITE_OTHER): Payer: Self-pay | Admitting: Psychiatry

## 2019-03-17 ENCOUNTER — Other Ambulatory Visit: Payer: Self-pay

## 2019-03-17 ENCOUNTER — Encounter: Payer: Self-pay | Admitting: Psychiatry

## 2019-03-17 DIAGNOSIS — F411 Generalized anxiety disorder: Secondary | ICD-10-CM

## 2019-03-17 NOTE — Progress Notes (Signed)
      [facility] Counselor/Therapist Progress Note  Patient ID: Gary Harper, MRN: [id number],    Date: 03/17/2019  Time Spent: 46 minutes   Treatment Type: Individual Therapy  Reported Symptoms: anxiety, stress  Mental Status Exam:  Appearance:   Casual     Behavior:  Appropriate  Motor:  Normal  Speech/Language:   Clear and Coherent  Affect:  Appropriate  Mood:  anxious  Thought process:  normal  Thought content:    WNL  Sensory/Perceptual disturbances:    WNL  Orientation:  oriented to person, place, time/date and situation  Attention:  Good  Concentration:  Good  Memory:  WNL  Fund of knowledge:   Good  Insight:    Good  Judgment:   Good  Impulse Control:  Good   Risk Assessment: Danger to Self:  No Self-injurious Behavior: No Danger to Others: No Duty to Warn:no Physical Aggression / Violence:No  Access to Firearms a concern: No  Gang Involvement:No  And his apartment complex Subjective: I connected with patient by a video enabled telemedicine application or telephone, with their informed consent, and verified patient privacy and that I am speaking with the correct person using two identifiers.  I was located at [facility] and patient at home. The client reports that he and his wife have gotten back together.  This happened in mid March.  He states that she had contacted him and decided that she wanted to be with him.  The biggest issue continues to be her family.  This causes him some level of stress and anxiety.  They have been spending every weekend together and she is planning on moving to [facility] and working there. The client discussed what he needed to continue to do to take care of himself.  He fishes and listens to music.  He and his wife have already started walking together.  He also has some stress related to his work.  With the pandemic there is less work that people are requesting.  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 The client also has access to the gym in his apartment complex which he is using.Marland Kitchen He continues with positive self talk and good self-care.  Interventions: Assertiveness/Communication, Solution-Oriented/Positive Psychology and Insight-Oriented  Diagnosis:   ICD-10-CM   1. Generalized anxiety disorder F41.1     Plan: Exercise, pleasurable activities, assertive communication, boundaries.  This record has been created using [employer].  Chart creation errors have been sought, but Crissa Sowder not always have been located and corrected. Such creation errors do not reflect on the standard of medical care.  Gelene Mink Lyrika Souders, [facility]

## 2019-03-31 ENCOUNTER — Ambulatory Visit: Payer: Self-pay | Admitting: Psychiatry

## 2019-03-31 ENCOUNTER — Other Ambulatory Visit: Payer: Self-pay

## 2019-04-14 ENCOUNTER — Ambulatory Visit: Payer: Self-pay | Admitting: Psychiatry

## 2019-04-28 ENCOUNTER — Ambulatory Visit: Payer: Self-pay | Admitting: Psychiatry

## 2019-04-28 ENCOUNTER — Encounter: Payer: Self-pay | Admitting: Psychiatry

## 2019-04-28 ENCOUNTER — Other Ambulatory Visit: Payer: Self-pay

## 2019-04-28 DIAGNOSIS — F411 Generalized anxiety disorder: Secondary | ICD-10-CM

## 2019-04-28 NOTE — Progress Notes (Signed)
      [facility] Counselor/Therapist Progress Note  Patient ID: Gary Harper, MRN: [id number],    Date: 04/28/2019  Time Spent: 48 minutes   Treatment Type: Individual Therapy  Reported Symptoms: anxiety  Mental Status Exam:  Appearance:   Casual and Well Groomed     Behavior:  Appropriate  Motor:  Normal  Speech/Language:   Clear and Coherent  Affect:  Appropriate  Mood:  anxious  Thought process:  normal  Thought content:    WNL  Sensory/Perceptual disturbances:    WNL  Orientation:  oriented to person, place, time/date and situation  Attention:  Good  Concentration:  Good  Memory:  WNL  Fund of knowledge:   Good  Insight:    Good  Judgment:   Good  Impulse Control:  Good   Risk Assessment: Danger to Self:  No Self-injurious Behavior: No Danger to Others: No Duty to Warn:no Physical Aggression / Violence:No  Access to Firearms a concern: No  Gang Involvement:No   Subjective: I met with the client face-to-face.  We both had facemasks. The client and his wife are still together.  The biggest difficulty the client has is when his wife interfaces with her family.  Recently both of her parents have been alternately in the hospital.  Her older siblings expect her to participate in the round the clock care for them.  I suggested to the client that it Gary Harper be time for them to call in hospice at least for an evaluation.  Palliative care services can help improve her parents quality of life as well as deal with pain and symptom management.  The client reports that the family has refused any of those services. I discussed with the client that in spite of his wife's control by her family he needs to maintain his own stability.  He agrees.  He has been exercising regularly by using a paddle board on [place].  He usually does this after work until the sun goes down.  That way he spends as little time as possible by himself.  It has been 1 year since his suicide attempt and also 1  year since using pornography.  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  Today he agrees that he has no suicidal ideation and it never crosses his mind.  He knows that his mental health has to be dependent on him and not his circumstances. When he and his wife are together their relationship is great.  She is applying for position as a Animal nutritionist in [place], [place].  This will get her out of [place] so they can be together in their own townhome.  The client hopes that this will happen by the fall. The client will continue with his self-care, exercise, boundaries and assertive communication.  The client will also begin practicing more mindfulness using deep breathing and through his exercise.  Interventions: Assertiveness/Communication, Mindfulness Meditation, Motivational Interviewing, Solution-Oriented/Positive Psychology and Insight-Oriented  Diagnosis:   ICD-10-CM   1. Generalized anxiety disorder F41.1     Plan: Mindfulness, self-care, exercise, boundaries, assertiveness.  This record has been created using [employer].  Chart creation errors have been sought, but Gary Harper not always have been located and corrected. Such creation errors do not reflect on the standard of medical care.  Gary Harper, [facility]

## 2019-05-12 ENCOUNTER — Ambulatory Visit: Payer: Self-pay | Admitting: Psychiatry

## 2019-05-31 ENCOUNTER — Other Ambulatory Visit: Payer: Self-pay | Admitting: Psychiatry

## 2019-06-02 ENCOUNTER — Ambulatory Visit: Payer: Self-pay | Admitting: Psychiatry

## 2019-06-30 ENCOUNTER — Encounter: Payer: Self-pay | Admitting: Psychiatry

## 2019-06-30 ENCOUNTER — Other Ambulatory Visit: Payer: Self-pay

## 2019-06-30 ENCOUNTER — Ambulatory Visit (INDEPENDENT_AMBULATORY_CARE_PROVIDER_SITE_OTHER): Payer: Self-pay | Admitting: Psychiatry

## 2019-06-30 DIAGNOSIS — F411 Generalized anxiety disorder: Secondary | ICD-10-CM

## 2019-06-30 NOTE — Progress Notes (Signed)
      [facility] Counselor/Therapist Progress Note  Patient ID: Gary Harper, MRN: [id number],    Date: 06/30/2019  Time Spent: 30 minutes   Treatment Type: Individual Therapy  Reported Symptoms: anxiety  Mental Status Exam:  Appearance:   Casual     Behavior:  Appropriate  Motor:  Normal  Speech/Language:   Clear and Coherent  Affect:  Appropriate  Mood:  anxious  Thought process:  normal  Thought content:    WNL  Sensory/Perceptual disturbances:    WNL  Orientation:  oriented to person, place, time/date and situation  Attention:  Good  Concentration:  Good  Memory:  WNL  Fund of knowledge:   Good  Insight:    Good  Judgment:   Good  Impulse Control:  Good   Risk Assessment: Danger to Self:  No Self-injurious Behavior: No Danger to Others: No Duty to Warn:no Physical Aggression / Violence:No  Access to Firearms a concern: No  Gang Involvement:No   Subjective: The client states that his wife's father has passed away.  He and his wife have not been together for the last month.  Her family once again pressured her to leave the client again.  His wife's dad's will stipulates that her money that she inherits is in the trust until she retires.  "1 more way of them controlling her." The client also disclosed that his wife divorced him in the last 3 weeks.  The client was shocked because they had talked that this was not the path that they would take.  The client has decided to quit his job in [place] and moved to [place] to live with his sister.  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  Once he gets some time under his belt he will move to [place], [place].  Even though all of this has been very hard for the client he has held it together.  He did not become suicidal but made a plan for his future.  He has remote hopes for reconciliation with his wife.  As long as her family continues to be involved they will not be  together.  Interventions: Assertiveness/Communication, Solution-Oriented/Positive Psychology and Insight-Oriented  Diagnosis:   ICD-10-CM   1. Generalized anxiety disorder  F41.1     Plan: Making future plans, boundaries, assertiveness, letter writing, exercise, self-care.  This record has been created using [employer].  Chart creation errors have been sought, but Buffy Ehler not always have been located and corrected. Such creation errors do not reflect on the standard of medical care.  Kennedee Kitzmiller, [facility]

## 2019-07-06 ENCOUNTER — Other Ambulatory Visit: Payer: Self-pay | Admitting: Psychiatry

## 2019-07-12 ENCOUNTER — Other Ambulatory Visit: Payer: Self-pay | Admitting: Psychiatry

## 2019-07-14 ENCOUNTER — Ambulatory Visit: Payer: Self-pay | Admitting: Psychiatry

## 2019-07-14 ENCOUNTER — Other Ambulatory Visit: Payer: Self-pay | Admitting: Psychiatry

## 2019-07-28 ENCOUNTER — Ambulatory Visit: Payer: Self-pay | Admitting: Psychiatry

## 2019-08-11 ENCOUNTER — Other Ambulatory Visit: Payer: Self-pay

## 2019-08-11 ENCOUNTER — Encounter: Payer: Self-pay | Admitting: Psychiatry

## 2019-08-11 ENCOUNTER — Ambulatory Visit (INDEPENDENT_AMBULATORY_CARE_PROVIDER_SITE_OTHER): Payer: Self-pay | Admitting: Psychiatry

## 2019-08-11 DIAGNOSIS — F411 Generalized anxiety disorder: Secondary | ICD-10-CM

## 2019-08-11 NOTE — Progress Notes (Signed)
      [facility] Counselor/Therapist Progress Note  Patient ID: Gary Harper, MRN: [id number],    Date: 08/11/2019  Time Spent: 50 minutes   Treatment Type: Individual Therapy  Reported Symptoms: anxiety  Mental Status Exam:  Appearance:   Casual     Behavior:  Appropriate  Motor:  Normal  Speech/Language:   Clear and Coherent  Affect:  Appropriate  Mood:  anxious  Thought process:  normal  Thought content:    WNL  Sensory/Perceptual disturbances:    WNL  Orientation:  oriented to person, place, time/date and situation  Attention:  Good  Concentration:  Good  Memory:  WNL  Fund of knowledge:   Good  Insight:    Good  Judgment:   Good  Impulse Control:  Good   Risk Assessment: Danger to Self:  No Self-injurious Behavior: No Danger to Others: No Duty to Warn:no Physical Aggression / Violence:No  Access to Firearms a concern: No  Gang Involvement:No   Subjective: The client has recently moved to [place] 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  He is between [place] and [place] currently.  His program starts October 1.  He is currently living with his sister in her apartment.  "We are good friends.  She has recently retired."  He lives close to lots of outdoor activity with lakes near by and parks with walking trails.  He has already started paddle boarding which has been positive for him. His classes will be online, "which will be intimidating".  The school provides him a computer.  He is a little concerned about being able to learn online.  He will be following up with Dr. Clovis Pu next week.  He will ask him about possible attention deficit medication to help with his focus and concentration.  He continues to do well with his current medication regimen with buspirone, lorazepam, sertraline, and trazodone.  He will be traveling back into the state to keep his appointments with Dr. Clovis Pu. Since his wife divorced him unexpectedly this summer she has  continued to stay in contact with him via text.  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  The only reason that their relationship fell apart was due to the pressure put upon his ex-wife by her family.  Her indecisiveness, high anxiety and being easily overwhelmed worked against them being able to put their marriage back together.  The client has not engaged in looking at porn at all since it came out last year.  His alcohol use has been low to moderate.  He manages his anxiety with running and outdoor activities. His long-term plan is to graduate from his school and then move to a beach in [place].  Today the client was bright and articulate with no suicidal or homicidal ideation.  He has acquired a part-time job with [employer].  Interventions: Assertiveness/Communication, Motivational Interviewing, Solution-Oriented/Positive Psychology and Insight-Oriented  Diagnosis:   ICD-10-CM   1. Generalized anxiety disorder  F41.1     Plan: Exercise, self-care, positive self talk, mood independent behavior, radical acceptance.  Cianna Kasparian,  [facility]

## 2019-08-14 ENCOUNTER — Encounter

## 2019-08-14 ENCOUNTER — Ambulatory Visit: Payer: Self-pay | Admitting: Psychiatry

## 2019-08-21 ENCOUNTER — Ambulatory Visit (INDEPENDENT_AMBULATORY_CARE_PROVIDER_SITE_OTHER): Payer: Self-pay | Admitting: Psychiatry

## 2019-08-21 ENCOUNTER — Other Ambulatory Visit: Payer: Self-pay

## 2019-08-21 ENCOUNTER — Encounter: Payer: Self-pay | Admitting: Psychiatry

## 2019-08-21 DIAGNOSIS — F411 Generalized anxiety disorder: Secondary | ICD-10-CM

## 2019-08-21 DIAGNOSIS — F331 Major depressive disorder, recurrent, moderate: Secondary | ICD-10-CM

## 2019-08-21 DIAGNOSIS — F4001 Agoraphobia with panic disorder: Secondary | ICD-10-CM

## 2019-08-21 DIAGNOSIS — G2581 Restless legs syndrome: Secondary | ICD-10-CM
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
## 2019-08-25 ENCOUNTER — Ambulatory Visit: Payer: Self-pay | Admitting: Psychiatry

## 2019-09-08 ENCOUNTER — Ambulatory Visit: Payer: Self-pay | Admitting: Psychiatry

## 2019-09-22 ENCOUNTER — Ambulatory Visit: Payer: Self-pay | Admitting: Psychiatry

## 2019-10-06 ENCOUNTER — Other Ambulatory Visit: Payer: Self-pay

## 2019-10-06 ENCOUNTER — Encounter: Payer: Self-pay | Admitting: Psychiatry

## 2019-10-06 ENCOUNTER — Ambulatory Visit (INDEPENDENT_AMBULATORY_CARE_PROVIDER_SITE_OTHER): Payer: Self-pay | Admitting: Psychiatry

## 2019-10-06 DIAGNOSIS — F411 Generalized anxiety disorder: Secondary | ICD-10-CM

## 2019-10-06 NOTE — Progress Notes (Signed)
      [facility] Counselor/Therapist Progress Note  Patient ID: JASMINE MACEACHERN, MRN: [id number],    Date: 10/06/2019  Time Spent: 50 minutes   Treatment Type: Individual Therapy  Reported Symptoms: anxious  Mental Status Exam:  Appearance:   Casual     Behavior:  Appropriate  Motor:  Normal  Speech/Language:   Clear and Coherent  Affect:  Appropriate  Mood:  anxious  Thought process:  normal  Thought content:    WNL  Sensory/Perceptual disturbances:    WNL  Orientation:  oriented to person, place, time/date and situation  Attention:  Good  Concentration:  Good  Memory:  WNL  Fund of knowledge:   Good  Insight:    Good  Judgment:   Good  Impulse Control:  Good   Risk Assessment: Danger to Self:  No Self-injurious Behavior: No Danger to Others: No Duty to Warn:no Physical Aggression / Violence:No  Access to Firearms a concern: No  Gang Involvement:No   Subjective: The client states that he has been talking with his ex-wife every day for the last 3 weeks.  "We are talking about getting back together."  The client was slated to travel to [place] this weekend to spend time with his ex-wife.  She had her appointment with her counselor.  She then called and said she was confused and did not think she could do it.  The client pointed out to his ex-wife that every time she talks with her counselor or her brother she becomes very confused and anxious and second-guesses herself. I emailed the client a copy of the decision-making form.  This is something he can share with his ex-wife and they can do together.  That helps to quantify the decision-making process assigning weighted values to advantages and disadvantages.  The client thought this would be great.  It is a way of cutting through the gray cloud of uncertainty in defining things more clearly. The client feels that he and his wife have a good shot if she can get past her fear.  She also has some issues of trust that he  Yousaf Sainato leave her.  The client has never left her but felt disconnected from her the last year they live together.  He feels he has figured out those issues and is ready to push forward.   Interventions: Assertiveness/Communication, Motivational Interviewing, Solution-Oriented/Positive Psychology, Psycho-education/Bibliotherapy and Insight-Oriented  Diagnosis:   ICD-10-CM   1. Generalized anxiety disorder  F41.1     Plan: Decision making form, assertiveness, boundaries, positive self talk, self-care, exercise.  Jenella Craigie, [facility]

## 2019-10-22 ENCOUNTER — Other Ambulatory Visit: Payer: Self-pay | Admitting: Psychiatry

## 2019-10-25 ENCOUNTER — Other Ambulatory Visit: Payer: Self-pay

## 2019-10-25 MED ORDER — TRAZODONE HCL 100 MG PO TABS: 100.0000 mg | ORAL_TABLET | Freq: Every day | ORAL | 0 refills | Status: DC

## 2019-10-27 ENCOUNTER — Ambulatory Visit: Payer: Self-pay | Admitting: Psychiatry

## 2019-11-10 ENCOUNTER — Ambulatory Visit: Payer: Self-pay | Admitting: Psychiatry

## 2019-12-13 ENCOUNTER — Other Ambulatory Visit: Payer: Self-pay

## 2019-12-13 ENCOUNTER — Telehealth: Payer: Self-pay | Admitting: Psychiatry

## 2019-12-13 MED ORDER — ROPINIROLE HCL 3 MG PO TABS: 3.0000 mg | ORAL_TABLET | Freq: Every day | ORAL | 2 refills | Status: DC

## 2019-12-13 NOTE — Telephone Encounter (Signed)
Returned call to [facility] in [place], they were requesting a refill of Ropinirole 3 mg 1 q hs #30 2 refills. Verbal given on the phone.

## 2019-12-13 NOTE — Telephone Encounter (Signed)
Pharmacy called concerning a refill on Ropinirole.

## 2020-02-14 ENCOUNTER — Other Ambulatory Visit: Payer: Self-pay | Admitting: Psychiatry

## 2020-02-14 NOTE — Telephone Encounter (Signed)
Last apt 07/2019 was due back 4 months

## 2020-07-15 ENCOUNTER — Telehealth: Payer: Self-pay | Admitting: Psychiatry

## 2020-07-15 NOTE — Telephone Encounter (Signed)
Pt has apt 10/21. Requesting refills for Sertraline, Trazodone, Ropinirole and Lorazepam. [facility] [place] location on file

## 2020-07-16 ENCOUNTER — Other Ambulatory Visit: Payer: Self-pay

## 2020-07-16 MED ORDER — SERTRALINE HCL 100 MG PO TABS: 200.0000 mg | ORAL_TABLET | Freq: Every day | ORAL | 0 refills | Status: DC

## 2020-07-16 MED ORDER — LORAZEPAM 1 MG PO TABS: 1.0000 mg | ORAL_TABLET | Freq: Every day | ORAL | 0 refills | Status: AC | PRN

## 2020-07-16 MED ORDER — TRAZODONE HCL 100 MG PO TABS: 100.0000 mg | ORAL_TABLET | Freq: Every day | ORAL | 0 refills | Status: DC

## 2020-07-16 MED ORDER — ROPINIROLE HCL 3 MG PO TABS: 3.0000 mg | ORAL_TABLET | Freq: Every day | ORAL | 0 refills | Status: DC

## 2020-07-16 NOTE — Telephone Encounter (Signed)
30 day supply of medication sent to [facility] in [place]

## 2020-07-16 NOTE — Telephone Encounter (Signed)
OK enough to get to appt

## 2020-07-16 NOTE — Telephone Encounter (Signed)
Patient is scheduled 10/21 but last visit was 07/2019. Refills okay? Going to [facility]

## 2020-08-26 ENCOUNTER — Other Ambulatory Visit: Payer: Self-pay | Admitting: Psychiatry

## 2020-08-26 NOTE — Telephone Encounter (Signed)
Please review

## 2020-09-12 ENCOUNTER — Encounter: Payer: Self-pay | Admitting: Psychiatry

## 2020-09-12 ENCOUNTER — Telehealth (INDEPENDENT_AMBULATORY_CARE_PROVIDER_SITE_OTHER): Payer: 59 | Admitting: Psychiatry

## 2020-09-12 DIAGNOSIS — F4001 Agoraphobia with panic disorder: Secondary | ICD-10-CM

## 2020-09-12 DIAGNOSIS — F3342 Major depressive disorder, recurrent, in full remission: Secondary | ICD-10-CM | POA: Diagnosis not present

## 2020-09-12 DIAGNOSIS — F411 Generalized anxiety disorder: Secondary | ICD-10-CM

## 2020-09-12 DIAGNOSIS — G2581 Restless legs syndrome: Secondary | ICD-10-CM | POA: Diagnosis not present

## 2020-09-12 DIAGNOSIS — F5105 Insomnia due to other mental disorder: Secondary | ICD-10-CM

## 2020-09-12 MED ORDER — TRAZODONE HCL 100 MG PO TABS: 100.0000 mg | ORAL_TABLET | Freq: Every day | ORAL | 3 refills | Status: AC

## 2020-09-12 MED ORDER — ROPINIROLE HCL 3 MG PO TABS: 3.0000 mg | ORAL_TABLET | Freq: Every day | ORAL | 3 refills | Status: AC

## 2020-09-12 MED ORDER — SERTRALINE HCL 100 MG PO TABS: 200.0000 mg | ORAL_TABLET | Freq: Every day | ORAL | 3 refills | Status: AC
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
## 2021-08-19 ENCOUNTER — Telehealth: Payer: Self-pay | Admitting: Psychiatry

## 2021-08-19 DIAGNOSIS — F4001 Agoraphobia with panic disorder: Secondary | ICD-10-CM

## 2021-08-19 DIAGNOSIS — F411 Generalized anxiety disorder: Secondary | ICD-10-CM

## 2021-08-19 DIAGNOSIS — F3342 Major depressive disorder, recurrent, in full remission: Secondary | ICD-10-CM

## 2021-08-19 NOTE — Telephone Encounter (Signed)
Call to RS

## 2021-08-22 NOTE — Telephone Encounter (Signed)
Call pt and tell him he needs to work on finding md to Rx in [place].  I'll cover RX for a couple of mos to give him time to do this

## 2021-08-22 NOTE — Telephone Encounter (Signed)
Pt has an appt in December. However he is in [place] and I don't know if insurance will cover this. I also told him that dr. Jennelle Human is not licensed in [place].

## 2021-08-22 NOTE — Telephone Encounter (Signed)
Please see message. °

## 2021-08-25 NOTE — Telephone Encounter (Signed)
LVM with info and let pt know to call back with a pharmacy

## 2021-11-03 ENCOUNTER — Ambulatory Visit: Payer: 59 | Admitting: Psychiatry

## 2022-03-19 ENCOUNTER — Other Ambulatory Visit: Payer: Self-pay | Admitting: Psychiatry

## 2022-03-19 DIAGNOSIS — G2581 Restless legs syndrome: Secondary | ICD-10-CM
